# Patient Record
Sex: Female | Born: 1965 | Race: Black or African American | Hispanic: No | Smoking: Never smoker
Health system: Southern US, Community
[De-identification: ages and names within clinical notes are randomized; demographics above are authoritative.]

## PROBLEM LIST (undated history)

## (undated) DIAGNOSIS — R42 Dizziness and giddiness: Secondary | ICD-10-CM

## (undated) DIAGNOSIS — F411 Generalized anxiety disorder: Secondary | ICD-10-CM

## (undated) DIAGNOSIS — Z8041 Family history of malignant neoplasm of ovary: Secondary | ICD-10-CM

## (undated) DIAGNOSIS — N390 Urinary tract infection, site not specified: Secondary | ICD-10-CM

## (undated) DIAGNOSIS — L509 Urticaria, unspecified: Secondary | ICD-10-CM

## (undated) DIAGNOSIS — J45909 Unspecified asthma, uncomplicated: Secondary | ICD-10-CM

## (undated) DIAGNOSIS — K589 Irritable bowel syndrome without diarrhea: Secondary | ICD-10-CM

## (undated) DIAGNOSIS — L989 Disorder of the skin and subcutaneous tissue, unspecified: Secondary | ICD-10-CM

## (undated) DIAGNOSIS — L2089 Other atopic dermatitis: Secondary | ICD-10-CM

## (undated) DIAGNOSIS — G629 Polyneuropathy, unspecified: Secondary | ICD-10-CM

## (undated) DIAGNOSIS — N951 Menopausal and female climacteric states: Secondary | ICD-10-CM

## (undated) DIAGNOSIS — I10 Essential (primary) hypertension: Secondary | ICD-10-CM

## (undated) DIAGNOSIS — M2550 Pain in unspecified joint: Secondary | ICD-10-CM

## (undated) DIAGNOSIS — R109 Unspecified abdominal pain: Secondary | ICD-10-CM

## (undated) DIAGNOSIS — T7421XA Adult sexual abuse, confirmed, initial encounter: Secondary | ICD-10-CM

## (undated) DIAGNOSIS — K219 Gastro-esophageal reflux disease without esophagitis: Secondary | ICD-10-CM

## (undated) DIAGNOSIS — M25569 Pain in unspecified knee: Secondary | ICD-10-CM

## (undated) DIAGNOSIS — J309 Allergic rhinitis, unspecified: Secondary | ICD-10-CM

## (undated) DIAGNOSIS — E559 Vitamin D deficiency, unspecified: Secondary | ICD-10-CM

## (undated) DIAGNOSIS — F419 Anxiety disorder, unspecified: Secondary | ICD-10-CM

## (undated) DIAGNOSIS — R253 Fasciculation: Secondary | ICD-10-CM

## (undated) DIAGNOSIS — R1013 Epigastric pain: Secondary | ICD-10-CM

## (undated) DIAGNOSIS — N3946 Mixed incontinence: Secondary | ICD-10-CM

## (undated) DIAGNOSIS — K6289 Other specified diseases of anus and rectum: Secondary | ICD-10-CM

## (undated) DIAGNOSIS — I4510 Unspecified right bundle-branch block: Secondary | ICD-10-CM

## (undated) DIAGNOSIS — G8929 Other chronic pain: Secondary | ICD-10-CM

## (undated) DIAGNOSIS — J029 Acute pharyngitis, unspecified: Secondary | ICD-10-CM

## (undated) DIAGNOSIS — L219 Seborrheic dermatitis, unspecified: Secondary | ICD-10-CM

## (undated) DIAGNOSIS — K5790 Diverticulosis of intestine, part unspecified, without perforation or abscess without bleeding: Secondary | ICD-10-CM

## (undated) DIAGNOSIS — M1710 Unilateral primary osteoarthritis, unspecified knee: Secondary | ICD-10-CM

## (undated) DIAGNOSIS — N318 Other neuromuscular dysfunction of bladder: Secondary | ICD-10-CM

## (undated) HISTORY — DX: Generalized anxiety disorder: F41.1

## (undated) HISTORY — DX: Gastro-esophageal reflux disease without esophagitis: K21.9

## (undated) HISTORY — DX: Other neuromuscular dysfunction of bladder: N31.8

## (undated) HISTORY — DX: Allergic rhinitis, unspecified: J30.9

## (undated) HISTORY — DX: Polyneuropathy, unspecified: G62.9

## (undated) HISTORY — DX: Menopausal and female climacteric states: N95.1

## (undated) HISTORY — DX: Dizziness and giddiness: R42

## (undated) HISTORY — DX: Mixed incontinence: N39.46

## (undated) HISTORY — DX: Unspecified asthma, uncomplicated: J45.909

## (undated) HISTORY — PX: ABDOMINAL HYSTERECTOMY: SHX81

## (undated) HISTORY — DX: Pain in unspecified joint: M25.50

## (undated) HISTORY — DX: Adult sexual abuse, confirmed, initial encounter: T74.21XA

## (undated) HISTORY — DX: Acute pharyngitis, unspecified: J02.9

## (undated) HISTORY — DX: Epigastric pain: R10.13

## (undated) HISTORY — DX: Vitamin D deficiency, unspecified: E55.9

## (undated) HISTORY — DX: Anxiety disorder, unspecified: F41.9

## (undated) HISTORY — PX: KNEE SURGERY: SHX244

## (undated) HISTORY — DX: Pain in unspecified knee: M25.569

## (undated) HISTORY — DX: Disorder of the skin and subcutaneous tissue, unspecified: L98.9

## (undated) HISTORY — DX: Urticaria, unspecified: L50.9

## (undated) HISTORY — DX: Seborrheic dermatitis, unspecified: L21.9

## (undated) HISTORY — DX: Unspecified right bundle-branch block: I45.10

## (undated) HISTORY — DX: Other atopic dermatitis: L20.89

## (undated) HISTORY — DX: Essential (primary) hypertension: I10

## (undated) HISTORY — DX: Family history of malignant neoplasm of ovary: Z80.41

## (undated) HISTORY — DX: Unilateral primary osteoarthritis, unspecified knee: M17.10

## (undated) HISTORY — DX: Urinary tract infection, site not specified: N39.0

---
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
## 2000-09-03 ENCOUNTER — Encounter: Admission: RE | Admit: 2000-09-03 | Discharge: 2000-09-03 | Payer: Self-pay | Admitting: Sports Medicine

## 2000-12-06 ENCOUNTER — Encounter: Admission: RE | Admit: 2000-12-06 | Discharge: 2000-12-06 | Payer: Self-pay | Admitting: Family Medicine

## 2001-07-08 ENCOUNTER — Encounter: Admission: RE | Admit: 2001-07-08 | Discharge: 2001-07-08 | Payer: Self-pay | Admitting: Sports Medicine

## 2001-07-28 ENCOUNTER — Encounter: Admission: RE | Admit: 2001-07-28 | Discharge: 2001-07-28 | Payer: Self-pay | Admitting: Family Medicine

## 2001-08-08 ENCOUNTER — Encounter: Admission: RE | Admit: 2001-08-08 | Discharge: 2001-08-08 | Payer: Self-pay | Admitting: Family Medicine

## 2001-09-02 ENCOUNTER — Encounter: Payer: Self-pay | Admitting: *Deleted

## 2001-09-02 ENCOUNTER — Encounter: Admission: RE | Admit: 2001-09-02 | Discharge: 2001-09-02 | Payer: Self-pay | Admitting: Family Medicine

## 2001-09-02 ENCOUNTER — Encounter: Admission: RE | Admit: 2001-09-02 | Discharge: 2001-09-02 | Payer: Self-pay | Admitting: *Deleted

## 2002-03-20 ENCOUNTER — Encounter: Admission: RE | Admit: 2002-03-20 | Discharge: 2002-03-20 | Payer: Self-pay | Admitting: Family Medicine

## 2003-12-22 ENCOUNTER — Encounter: Admission: RE | Admit: 2003-12-22 | Discharge: 2003-12-22 | Payer: Self-pay | Admitting: Emergency Medicine

## 2004-10-06 ENCOUNTER — Emergency Department (HOSPITAL_COMMUNITY): Admission: EM | Admit: 2004-10-06 | Discharge: 2004-10-07 | Payer: Self-pay | Admitting: Emergency Medicine

## 2012-11-18 ENCOUNTER — Telehealth: Payer: Self-pay | Admitting: Gastroenterology

## 2012-12-22 ENCOUNTER — Telehealth (INDEPENDENT_AMBULATORY_CARE_PROVIDER_SITE_OTHER): Payer: Self-pay

## 2012-12-22 NOTE — Telephone Encounter (Signed)
Pt called [phone connection poor]. Pt called stating she was directed by Dr Norval Gable office to call our office to be seen due to increased abd pain. Pt states she was on her way to ER. I advised pt since we have not seen her and do not have her records she should go to ER if her pain is 8 or above. Pt states she will continue to ER.

## 2012-12-30 ENCOUNTER — Ambulatory Visit (INDEPENDENT_AMBULATORY_CARE_PROVIDER_SITE_OTHER): Payer: Self-pay | Admitting: Surgery

## 2013-01-01 ENCOUNTER — Encounter (INDEPENDENT_AMBULATORY_CARE_PROVIDER_SITE_OTHER): Payer: Self-pay

## 2013-01-02 ENCOUNTER — Ambulatory Visit (INDEPENDENT_AMBULATORY_CARE_PROVIDER_SITE_OTHER): Payer: Non-veteran care | Admitting: General Surgery

## 2013-01-02 ENCOUNTER — Encounter (INDEPENDENT_AMBULATORY_CARE_PROVIDER_SITE_OTHER): Payer: Self-pay | Admitting: General Surgery

## 2013-01-02 VITALS — BP 120/86 | HR 95 | Temp 98.6°F | Resp 16 | Ht 60.0 in | Wt 146.8 lb

## 2013-01-02 DIAGNOSIS — K5900 Constipation, unspecified: Secondary | ICD-10-CM | POA: Insufficient documentation
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
## 2013-01-02 NOTE — Patient Instructions (Signed)
GETTING TO GOOD BOWEL HEALTH. Irregular bowel habits such as constipation can lead to many problems over time.  Having one soft bowel movement a day is the most important way to prevent further problems.  The anorectal canal is designed to handle stretching and feces to safely manage our ability to get rid of solid waste (feces, poop, stool) out of our body.  BUT, hard constipated stools can act like ripping concrete bricks causing inflamed hemorrhoids, anal fissures, abdominal pain and bloating.     The goal: ONE SOFT BOWEL MOVEMENT A DAY!  To have soft, regular bowel movements:    Drink at least 8 tall glasses of water a day.     Take plenty of fiber.  Fiber is the undigested part of plant food that passes into the colon, acting s "natures broom" to encourage bowel motility and movement.  Fiber can absorb and hold large amounts of water. This results in a larger, bulkier stool, which is soft and easier to pass. Work gradually over several weeks up to 6 servings a day of fiber (25g a day even more if needed) in the form of: o Vegetables -- Root (potatoes, carrots, turnips), leafy green (lettuce, salad greens, celery, spinach), or cooked high residue (cabbage, broccoli, etc) o Fruit -- Fresh (unpeeled skin & pulp), Dried (prunes, apricots, cherries, etc ),  or stewed ( applesauce)  o Whole grain breads, pasta, etc (whole wheat)  o Bran cereals    Bulking Agents -- This type of water-retaining fiber generally is easily obtained each day by one of the following:  o Psyllium bran -- The psyllium plant is remarkable because its ground seeds can retain so much water. This product is available as Metamucil, Konsyl, Effersyllium, Per Diem Fiber, or the less expensive generic preparation in drug and health food stores. Although labeled a laxative, it really is not a laxative.  o Methylcellulose -- This is another fiber derived from wood which also retains water. It is available as Citrucel. o Polyethylene Glycol  - and "artificial" fiber commonly called Miralax or Glycolax.  It is helpful for people with gassy or bloated feelings with regular fiber o Flax Seed - a less gassy fiber than psyllium   No reading or other relaxing activity while on the toilet. If bowel movements take longer than 5 minutes, you are too constipated.   AVOID CONSTIPATION.  High fiber and water intake usually takes care of this.  Sometimes a laxative is needed to stimulate more frequent bowel movements, but    Laxatives are not a good long-term solution as it can wear the colon out. o Osmotics (Milk of Magnesia, Fleets phosphosoda, Magnesium citrate, MiraLax, GoLytely) are safer than  o Stimulants (Senokot, Castor Oil, Dulcolax, Ex Lax)    o Do not take laxatives for more than 7days in a row.    IF SEVERELY CONSTIPATED, try a Bowel Retraining Program: o Do not use laxatives.  o Eat a diet high in roughage, such as bran cereals and leafy vegetables.  o Drink six (6) ounces of prune or apricot juice each morning.  o Eat two (2) large servings of stewed fruit each day.  o Take one (1) heaping tablespoon of a psyllium-based bulking agent twice a day. Use sugar-free sweetener when possible to avoid excessive calories.  o Eat a normal breakfast.  o Set aside 15 minutes after breakfast to sit on the toilet, but do not strain to have a bowel movement.  o If you do   not have a bowel movement by the third day, use an enema and repeat the above steps.   Fiber Chart  You should 25-30g of fiber per day and drinking 8 glasses of water to help your bowels move regularly.  In the chart below you can look up how much fiber you are getting in an average day.  If you are not getting enough fiber, you should add a fiber supplement to your diet.  Examples of this include Metamucil, FiberCon and Citrucel.  These can be purchased at your local grocery store or pharmacy.       http://www.[employer].edu/offices/health/nutritioncoach/Ato[employer]/handouts/Fiber.pdf  

## 2013-01-26 NOTE — Telephone Encounter (Signed)
Was declined by Dr. Jarold Motto and Dr. Christella Hartigan.  Seen @ [facility]

## 2013-03-08 ENCOUNTER — Emergency Department (HOSPITAL_COMMUNITY)
Admission: EM | Admit: 2013-03-08 | Discharge: 2013-03-08 | Disposition: A | Discharge status: Home / Self Care | Payer: BC Managed Care – PPO | Attending: Emergency Medicine | Admitting: Emergency Medicine

## 2013-03-08 ENCOUNTER — Encounter (HOSPITAL_COMMUNITY): Payer: Self-pay | Admitting: Emergency Medicine

## 2013-03-08 DIAGNOSIS — K219 Gastro-esophageal reflux disease without esophagitis: Secondary | ICD-10-CM | POA: Insufficient documentation

## 2013-03-08 DIAGNOSIS — Z3202 Encounter for pregnancy test, result negative: Secondary | ICD-10-CM | POA: Insufficient documentation

## 2013-03-08 DIAGNOSIS — L2089 Other atopic dermatitis: Secondary | ICD-10-CM | POA: Insufficient documentation

## 2013-03-08 DIAGNOSIS — Z8041 Family history of malignant neoplasm of ovary: Secondary | ICD-10-CM | POA: Insufficient documentation

## 2013-03-08 DIAGNOSIS — M1710 Unilateral primary osteoarthritis, unspecified knee: Secondary | ICD-10-CM | POA: Insufficient documentation

## 2013-03-08 DIAGNOSIS — Z79899 Other long term (current) drug therapy: Secondary | ICD-10-CM | POA: Insufficient documentation

## 2013-03-08 DIAGNOSIS — K5900 Constipation, unspecified: Secondary | ICD-10-CM | POA: Insufficient documentation

## 2013-03-08 DIAGNOSIS — I4510 Unspecified right bundle-branch block: Secondary | ICD-10-CM | POA: Insufficient documentation

## 2013-03-08 DIAGNOSIS — J45909 Unspecified asthma, uncomplicated: Secondary | ICD-10-CM | POA: Insufficient documentation

## 2013-03-08 DIAGNOSIS — Z8742 Personal history of other diseases of the female genital tract: Secondary | ICD-10-CM | POA: Insufficient documentation

## 2013-03-08 DIAGNOSIS — Z87440 Personal history of urinary (tract) infections: Secondary | ICD-10-CM | POA: Insufficient documentation

## 2013-03-08 DIAGNOSIS — Z881 Allergy status to other antibiotic agents status: Secondary | ICD-10-CM | POA: Insufficient documentation

## 2013-03-08 DIAGNOSIS — I10 Essential (primary) hypertension: Secondary | ICD-10-CM | POA: Insufficient documentation

## 2013-03-08 DIAGNOSIS — J309 Allergic rhinitis, unspecified: Secondary | ICD-10-CM | POA: Insufficient documentation

## 2013-03-08 DIAGNOSIS — F411 Generalized anxiety disorder: Secondary | ICD-10-CM | POA: Insufficient documentation

## 2013-03-08 DIAGNOSIS — G589 Mononeuropathy, unspecified: Secondary | ICD-10-CM | POA: Insufficient documentation

## 2013-03-08 DIAGNOSIS — H539 Unspecified visual disturbance: Secondary | ICD-10-CM | POA: Insufficient documentation

## 2013-03-08 DIAGNOSIS — Z87448 Personal history of other diseases of urinary system: Secondary | ICD-10-CM | POA: Insufficient documentation

## 2013-03-08 DIAGNOSIS — Z8719 Personal history of other diseases of the digestive system: Secondary | ICD-10-CM | POA: Insufficient documentation

## 2013-03-08 DIAGNOSIS — R110 Nausea: Secondary | ICD-10-CM | POA: Insufficient documentation

## 2013-03-08 DIAGNOSIS — R109 Unspecified abdominal pain: Secondary | ICD-10-CM

## 2013-03-08 LAB — COMPREHENSIVE METABOLIC PANEL
ALT: 16 U/L (ref 0–35)
AST: 19 U/L (ref 0–37)
Albumin: 3.9 g/dL (ref 3.5–5.2)
BUN: 15 mg/dL (ref 6–23)
CO2: 23 mEq/L (ref 19–32)
Calcium: 9.8 mg/dL (ref 8.4–10.5)
Creatinine, Ser: 0.7 mg/dL (ref 0.50–1.10)
Sodium: 136 mEq/L (ref 135–145)
Total Protein: 7.8 g/dL (ref 6.0–8.3)

## 2013-03-08 LAB — CBC WITH DIFFERENTIAL/PLATELET
Basophils Absolute: 0 10*3/uL (ref 0.0–0.1)
Basophils Relative: 0 % (ref 0–1)
Eosinophils Absolute: 0.2 10*3/uL (ref 0.0–0.7)
Eosinophils Relative: 2 % (ref 0–5)
Lymphocytes Relative: 46 % (ref 12–46)
MCHC: 34.5 g/dL (ref 30.0–36.0)
MCV: 83.2 fL (ref 78.0–100.0)
Monocytes Absolute: 0.6 10*3/uL (ref 0.1–1.0)
Platelets: 198 10*3/uL (ref 150–400)
RBC: 4.46 MIL/uL (ref 3.87–5.11)
RDW: 13.8 % (ref 11.5–15.5)
WBC: 7.1 10*3/uL (ref 4.0–10.5)

## 2013-03-08 LAB — URINALYSIS, ROUTINE W REFLEX MICROSCOPIC
Glucose, UA: NEGATIVE mg/dL
Hgb urine dipstick: NEGATIVE
Leukocytes, UA: NEGATIVE
Specific Gravity, Urine: 1.019 (ref 1.005–1.030)

## 2013-03-08 LAB — POCT PREGNANCY, URINE: Preg Test, Ur: NEGATIVE

## 2013-03-08 MED ORDER — SODIUM CHLORIDE 0.9 % IV BOLUS (SEPSIS)
1000.0000 mL | Freq: Once | INTRAVENOUS | Status: AC
  Administered 2013-03-08: 1000 mL via INTRAVENOUS

## 2013-03-08 MED ORDER — MORPHINE SULFATE 4 MG/ML IJ SOLN
4.0000 mg | Freq: Once | INTRAMUSCULAR | Status: AC
  Administered 2013-03-08: 4 mg via INTRAVENOUS
  Filled 2013-03-08: qty 1
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
## 2013-03-08 NOTE — ED Notes (Signed)
MD at bedside. 

## 2013-03-08 NOTE — ED Notes (Signed)
Pt states understanding of discharge instructions 

## 2013-03-08 NOTE — ED Notes (Signed)
Pt reports hx of diverticulitis and recently took antibiotics for it, started having left side abd pain again last week and having mucus in stools.

## 2013-03-10 ENCOUNTER — Encounter (INDEPENDENT_AMBULATORY_CARE_PROVIDER_SITE_OTHER): Payer: Self-pay

## 2013-03-10 ENCOUNTER — Telehealth (INDEPENDENT_AMBULATORY_CARE_PROVIDER_SITE_OTHER): Payer: Self-pay

## 2013-03-10 NOTE — Telephone Encounter (Signed)
Pt called stating she is considering colon surgery and would like Dr Maisie Fus to review her CT images from [place]. Pt advised to get the images sent to Dr Maisie Fus attention [place] so MD can view images. Pt also wants to have another appt to discuss surgery since she continues to have LLQ abd pain attacks. I advised pt this request will be sent to Dr Maisie Fus and her nurse for review. Pt can be reached at [phone].

## 2013-03-10 NOTE — Telephone Encounter (Signed)
I called the pt back.  I told her we already have the CT disc.  We don't have the reports though.  The pt is driving to [place] as we speak.  She states she has been treated 3 other times for diverticular flare ups.  She has had several ct scans.  She has had colonoscopies.  All of this was done at the [place].  I asked her to get all of those reports for [place] while she is there.  We scheduled her an appointment for her to come in next week on a day that worked for her.  I can't locate her previous records in Epic that we had at her last visit in August.  I have asked Medical Records to check.

## 2013-03-13 NOTE — Telephone Encounter (Signed)
CT reports reviewed.  Would not recommend surgery at this time, based on the info that I have received.  Recommend high fiber diet, plenty of liquids and regular exercise.

## 2013-03-16 ENCOUNTER — Encounter (INDEPENDENT_AMBULATORY_CARE_PROVIDER_SITE_OTHER): Payer: Self-pay

## 2013-03-18 ENCOUNTER — Ambulatory Visit (INDEPENDENT_AMBULATORY_CARE_PROVIDER_SITE_OTHER): Payer: Federal, State, Local not specified - PPO | Admitting: General Surgery

## 2013-03-18 ENCOUNTER — Encounter (INDEPENDENT_AMBULATORY_CARE_PROVIDER_SITE_OTHER): Payer: Self-pay | Admitting: General Surgery

## 2013-03-18 VITALS — BP 124/78 | HR 88 | Temp 97.6°F | Resp 16 | Ht 61.0 in | Wt 150.4 lb

## 2013-03-18 DIAGNOSIS — R1032 Left lower quadrant pain: Secondary | ICD-10-CM

## 2013-03-18 NOTE — Progress Notes (Signed)
Maureen Smith is a 47 y.o. female who is here for a follow up visit regarding her LLQ pain.  She reports "episodes" every other month that respond to antibiotics.  She reports only pain, no fevers or elevated WBC.  She has very vague findings on CT that may represent diverticulitis.  This did not correlate with an elevated wbc.  Objective: Filed Vitals:   03/18/13 1637  BP: 124/78  Pulse: 88  Temp: 97.6 F (36.4 C)  Resp: 16    General appearance: alert and cooperative GI: normal findings: soft, non-tender   Assessment and Plan: Given the evidence that has presented to me, I do not see any evidence that removing her sigmoid would definitely help her pain.  I gave her a 50% chance that a colectomy would help her.  That is not enough for me to do a colectomy.  I recommended continuing to work up her pain with her GI specialist at the [place].      Marland KitchenVanita Panda, MD [facility], [place] [phone]

## 2013-03-26 ENCOUNTER — Other Ambulatory Visit: Payer: Self-pay

## 2013-08-14 ENCOUNTER — Ambulatory Visit (INDEPENDENT_AMBULATORY_CARE_PROVIDER_SITE_OTHER): Payer: BC Managed Care – PPO | Admitting: Internal Medicine

## 2013-08-14 ENCOUNTER — Encounter: Payer: Self-pay | Admitting: Internal Medicine

## 2013-08-14 VITALS — BP 133/86 | HR 89 | Ht 61.0 in | Wt 149.0 lb

## 2013-08-14 DIAGNOSIS — R0602 Shortness of breath: Secondary | ICD-10-CM

## 2013-08-14 DIAGNOSIS — Z0000 Encounter for general adult medical examination without abnormal findings: Secondary | ICD-10-CM

## 2013-08-14 NOTE — Patient Instructions (Signed)
Your physician recommends that you schedule a follow-up appointment in: [place] physician has requested that you have an echocardiogram. Echocardiography is a painless test that uses sound waves to create images of your heart. It provides your doctor with information about the size and shape of your heart and how well your heart's chambers and valves are working. This procedure takes approximately one hour. There are no restrictions for this procedure.   Your physician recommends that you return for lab work in: FASTING WITH ECHO
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
## 2013-08-27 ENCOUNTER — Other Ambulatory Visit (INDEPENDENT_AMBULATORY_CARE_PROVIDER_SITE_OTHER): Payer: Federal, State, Local not specified - PPO

## 2013-08-27 ENCOUNTER — Ambulatory Visit (HOSPITAL_COMMUNITY): Payer: Federal, State, Local not specified - PPO

## 2013-08-27 DIAGNOSIS — Z0000 Encounter for general adult medical examination without abnormal findings: Secondary | ICD-10-CM

## 2013-08-27 DIAGNOSIS — R0602 Shortness of breath: Secondary | ICD-10-CM

## 2013-08-27 LAB — LIPID PANEL
Cholesterol: 191 mg/dL (ref 0–200)
HDL: 37.1 mg/dL — ABNORMAL LOW (ref >39.00)
LDL Cholesterol: 119 mg/dL — ABNORMAL HIGH (ref 0–99)
Total CHOL/HDL Ratio: 5
Triglycerides: 174 mg/dL — ABNORMAL HIGH (ref 0.0–149.0)
VLDL: 34.8 mg/dL (ref 0.0–40.0)

## 2013-09-17 ENCOUNTER — Other Ambulatory Visit (HOSPITAL_COMMUNITY): Payer: BC Managed Care – PPO

## 2013-09-17 ENCOUNTER — Other Ambulatory Visit: Payer: BC Managed Care – PPO

## 2013-09-17 ENCOUNTER — Ambulatory Visit: Payer: BC Managed Care – PPO | Admitting: Cardiology

## 2013-09-18 ENCOUNTER — Telehealth: Payer: Self-pay | Admitting: Internal Medicine

## 2013-09-18 NOTE — Telephone Encounter (Signed)
Forward to Debra M. RN 

## 2013-09-18 NOTE — Telephone Encounter (Signed)
New message    Patient calling stating she will be dropping off echo results she had done at [place] .

## 2013-09-21 NOTE — Telephone Encounter (Signed)
I have not seen echo from [place]

## 2013-09-23 ENCOUNTER — Telehealth: Payer: Self-pay | Admitting: Internal Medicine

## 2013-09-23 NOTE — Telephone Encounter (Signed)
Walk In pt Form " Echo Report" Dropped Off Maureen Smith Back Thursday 5.7

## 2013-09-30 ENCOUNTER — Encounter: Payer: Self-pay | Admitting: *Deleted

## 2013-09-30 NOTE — Telephone Encounter (Signed)
Left message for pt to call  Dr Harrington Challenger has reviewed echo from the [place] She would like the pt to have a dobutamine echo with diastolic prep at peck infusion along with wall motion.

## 2013-10-14 ENCOUNTER — Encounter: Payer: Self-pay | Admitting: *Deleted

## 2013-10-15 ENCOUNTER — Telehealth: Payer: Self-pay | Admitting: *Deleted

## 2013-10-15 ENCOUNTER — Ambulatory Visit: Payer: Federal, State, Local not specified - PPO | Admitting: Physician Assistant

## 2013-10-15 DIAGNOSIS — R0600 Dyspnea, unspecified: Secondary | ICD-10-CM

## 2013-10-15 NOTE — Telephone Encounter (Signed)
pt notified about needing the Dobutamine Echo per Dr. Harrington Challenger, pt is now scheduled for 6/22 for echo and f/u w/PA 11/16/13. Pt verbalized Plan of Care.

## 2013-10-15 NOTE — Telephone Encounter (Signed)
Follow up         Pt is stating she had an echocardiogram done at the [facility] office and she brought in the results / Is this the same as a Dobutamine Echo?

## 2013-10-15 NOTE — Telephone Encounter (Signed)
lmptcb per Dr. Harrington Challenger pt needs to be scheduled for Dobutamine Echo before her f/u 6/29 w/Scott W. PAC.  dobutamine echo with diastolic prep at peck infusion along with wall motion, per Dr. Harrington Challenger

## 2013-10-15 NOTE — Telephone Encounter (Signed)
pt asked for echo instructions to be mailed to her. I lmom of her echo instructions as well as mailing them today to pt. i lmom if any questions please call [phone].

## 2013-11-09 ENCOUNTER — Ambulatory Visit (HOSPITAL_COMMUNITY): Payer: Federal, State, Local not specified - PPO | Attending: Internal Medicine

## 2013-11-09 ENCOUNTER — Other Ambulatory Visit (HOSPITAL_COMMUNITY): Payer: Federal, State, Local not specified - PPO | Admitting: *Deleted

## 2013-11-09 DIAGNOSIS — R0600 Dyspnea, unspecified: Secondary | ICD-10-CM

## 2013-11-09 DIAGNOSIS — R0609 Other forms of dyspnea: Secondary | ICD-10-CM | POA: Insufficient documentation

## 2013-11-09 DIAGNOSIS — R079 Chest pain, unspecified: Secondary | ICD-10-CM

## 2013-11-09 DIAGNOSIS — R0602 Shortness of breath: Secondary | ICD-10-CM

## 2013-11-09 DIAGNOSIS — R0989 Other specified symptoms and signs involving the circulatory and respiratory systems: Secondary | ICD-10-CM | POA: Insufficient documentation

## 2013-11-09 MED ORDER — SODIUM CHLORIDE 0.9 % IV SOLN
40.0000 ug/kg | Freq: Once | INTRAVENOUS | Status: AC
  Administered 2013-11-09: 40 ug/kg/min via INTRAVENOUS

## 2013-11-09 NOTE — Progress Notes (Signed)
Dobutamine stress echo performed.

## 2013-11-12 ENCOUNTER — Telehealth: Payer: Self-pay | Admitting: Cardiology

## 2013-11-12 NOTE — Telephone Encounter (Signed)
Pt calling to inform Dr Harrington Challenger, Richardson Dopp PA, and CMA Julaine Hua that instead of having to come in for a office visit to review echo results, could someone just call her with the Dobutamine Echo results and follow-up as needed.  Pt states its very hard to come in for OV because of working a strict schedule with the [place].  Pt has a appt with Scott on 7/21, and states to keep that if its absolutely necessary for her to be seen.  Pt states the appt with Scott on 7/21 can be cancelled, if the echo report reveals normal.  Informed pt that all parties are out of the office today, but I will further advise by sending them this message.  Pt verbalized understanding and agrees with this plan.

## 2013-11-12 NOTE — Telephone Encounter (Signed)
New Message  Instead of coming in for the appt. Pt requests a call back to discuss ECHO results// Please call

## 2013-11-13 NOTE — Telephone Encounter (Signed)
This is up to Dr. Harrington Challenger. I have not seen this patient yet and do not know the reason for the Dobutamine stress test. Richardson Dopp, PA-C   11/13/2013 2:08 PM

## 2013-11-16 ENCOUNTER — Ambulatory Visit: Payer: Federal, State, Local not specified - PPO | Admitting: Physician Assistant

## 2013-11-17 ENCOUNTER — Telehealth: Payer: Self-pay | Admitting: *Deleted

## 2013-11-17 DIAGNOSIS — R0602 Shortness of breath: Secondary | ICD-10-CM

## 2013-11-17 NOTE — Telephone Encounter (Signed)
Per Dr. Harrington Challenger, she left patient a message. "Stress test is normal, SOB does not appear to be due to heart changes." Per Dr. Harrington Challenger can refer for PFTs. Appointment 7/21 cancelled.

## 2013-12-08 ENCOUNTER — Ambulatory Visit: Payer: Federal, State, Local not specified - PPO | Admitting: Physician Assistant

## 2014-09-27 ENCOUNTER — Emergency Department (HOSPITAL_COMMUNITY): Payer: Federal, State, Local not specified - PPO

## 2014-09-27 ENCOUNTER — Encounter (HOSPITAL_COMMUNITY): Payer: Self-pay

## 2014-09-27 ENCOUNTER — Emergency Department (HOSPITAL_COMMUNITY)
Admission: EM | Admit: 2014-09-27 | Discharge: 2014-09-27 | Disposition: A | Discharge status: Home / Self Care | Payer: Federal, State, Local not specified - PPO | Attending: Emergency Medicine | Admitting: Emergency Medicine

## 2014-09-27 DIAGNOSIS — I10 Essential (primary) hypertension: Secondary | ICD-10-CM | POA: Diagnosis not present

## 2014-09-27 DIAGNOSIS — Z79899 Other long term (current) drug therapy: Secondary | ICD-10-CM | POA: Insufficient documentation

## 2014-09-27 DIAGNOSIS — E559 Vitamin D deficiency, unspecified: Secondary | ICD-10-CM | POA: Insufficient documentation

## 2014-09-27 DIAGNOSIS — R109 Unspecified abdominal pain: Secondary | ICD-10-CM

## 2014-09-27 DIAGNOSIS — K602 Anal fissure, unspecified: Secondary | ICD-10-CM | POA: Diagnosis not present

## 2014-09-27 DIAGNOSIS — Z872 Personal history of diseases of the skin and subcutaneous tissue: Secondary | ICD-10-CM | POA: Insufficient documentation

## 2014-09-27 DIAGNOSIS — G8929 Other chronic pain: Secondary | ICD-10-CM | POA: Insufficient documentation

## 2014-09-27 DIAGNOSIS — Z87440 Personal history of urinary (tract) infections: Secondary | ICD-10-CM | POA: Diagnosis not present

## 2014-09-27 DIAGNOSIS — Z8659 Personal history of other mental and behavioral disorders: Secondary | ICD-10-CM | POA: Diagnosis not present

## 2014-09-27 DIAGNOSIS — Z8739 Personal history of other diseases of the musculoskeletal system and connective tissue: Secondary | ICD-10-CM | POA: Diagnosis not present

## 2014-09-27 DIAGNOSIS — Z87448 Personal history of other diseases of urinary system: Secondary | ICD-10-CM | POA: Insufficient documentation

## 2014-09-27 DIAGNOSIS — R1084 Generalized abdominal pain: Secondary | ICD-10-CM | POA: Diagnosis present

## 2014-09-27 DIAGNOSIS — J45909 Unspecified asthma, uncomplicated: Secondary | ICD-10-CM | POA: Diagnosis not present

## 2014-09-27 DIAGNOSIS — Z90710 Acquired absence of both cervix and uterus: Secondary | ICD-10-CM | POA: Insufficient documentation

## 2014-09-27 DIAGNOSIS — K529 Noninfective gastroenteritis and colitis, unspecified: Secondary | ICD-10-CM

## 2014-09-27 HISTORY — DX: Irritable bowel syndrome, unspecified: K58.9

## 2014-09-27 HISTORY — DX: Other chronic pain: G89.29

## 2014-09-27 HISTORY — DX: Unspecified abdominal pain: R10.9

## 2014-09-27 HISTORY — DX: Other specified diseases of anus and rectum: K62.89

## 2014-09-27 HISTORY — DX: Diverticulosis of intestine, part unspecified, without perforation or abscess without bleeding: K57.90

## 2014-09-27 LAB — URINALYSIS, ROUTINE W REFLEX MICROSCOPIC
BILIRUBIN URINE: NEGATIVE
Glucose, UA: NEGATIVE mg/dL
HGB URINE DIPSTICK: NEGATIVE
Ketones, ur: NEGATIVE mg/dL
Leukocytes, UA: NEGATIVE
NITRITE: NEGATIVE
Protein, ur: NEGATIVE mg/dL
Specific Gravity, Urine: 1.006 (ref 1.005–1.030)
UROBILINOGEN UA: 0.2 mg/dL (ref 0.0–1.0)
pH: 7.5 (ref 5.0–8.0)

## 2014-09-27 LAB — COMPREHENSIVE METABOLIC PANEL WITH GFR
ALT: 14 U/L (ref 14–54)
AST: 24 U/L (ref 15–41)
Albumin: 4 g/dL (ref 3.5–5.0)
Alkaline Phosphatase: 71 U/L (ref 38–126)
Anion gap: 12 (ref 5–15)
BUN: 15 mg/dL (ref 6–20)
CO2: 23 mmol/L (ref 22–32)
Calcium: 9.4 mg/dL (ref 8.9–10.3)
Chloride: 102 mmol/L (ref 101–111)
Creatinine, Ser: 0.82 mg/dL (ref 0.44–1.00)
GFR calc Af Amer: >60 mL/min
GFR calc non Af Amer: >60 mL/min
Glucose, Bld: 115 mg/dL — ABNORMAL HIGH (ref 70–99)
Potassium: 3.8 mmol/L (ref 3.5–5.1)
Sodium: 137 mmol/L (ref 135–145)
Total Bilirubin: 0.9 mg/dL (ref 0.3–1.2)
Total Protein: 7.5 g/dL (ref 6.5–8.1)

## 2014-09-27 LAB — CBC WITH DIFFERENTIAL/PLATELET
Basophils Absolute: 0 K/uL (ref 0.0–0.1)
Basophils Relative: 0 % (ref 0–1)
Eosinophils Absolute: 0.1 K/uL (ref 0.0–0.7)
Eosinophils Relative: 1 % (ref 0–5)
HCT: 38.1 % (ref 36.0–46.0)
Hemoglobin: 12.6 g/dL (ref 12.0–15.0)
Lymphocytes Relative: 26 % (ref 12–46)
Lymphs Abs: 2.1 K/uL (ref 0.7–4.0)
MCH: 27.8 pg (ref 26.0–34.0)
MCHC: 33.1 g/dL (ref 30.0–36.0)
MCV: 83.9 fL (ref 78.0–100.0)
Monocytes Absolute: 0.5 K/uL (ref 0.1–1.0)
Monocytes Relative: 6 % (ref 3–12)
Neutro Abs: 5.4 K/uL (ref 1.7–7.7)
Neutrophils Relative %: 67 % (ref 43–77)
Platelets: 231 K/uL (ref 150–400)
RBC: 4.54 MIL/uL (ref 3.87–5.11)
RDW: 13.9 % (ref 11.5–15.5)
WBC: 8 K/uL (ref 4.0–10.5)

## 2014-09-27 LAB — POC OCCULT BLOOD, ED: Fecal Occult Bld: POSITIVE — AB

## 2014-09-27 LAB — LIPASE, BLOOD: Lipase: 15 U/L — ABNORMAL LOW (ref 22–51)

## 2014-09-27 MED ORDER — METRONIDAZOLE 500 MG PO TABS: 500.0000 mg | ORAL_TABLET | Freq: Three times a day (TID) | ORAL | Status: DC

## 2014-09-27 MED ORDER — CIPROFLOXACIN HCL 500 MG PO TABS
500.0000 mg | ORAL_TABLET | Freq: Once | ORAL | Status: AC
  Administered 2014-09-27: 500 mg via ORAL
  Filled 2014-09-27: qty 1

## 2014-09-27 MED ORDER — METRONIDAZOLE 500 MG PO TABS
500.0000 mg | ORAL_TABLET | Freq: Once | ORAL | Status: AC
  Administered 2014-09-27: 500 mg via ORAL
  Filled 2014-09-27: qty 1

## 2014-09-27 MED ORDER — DICYCLOMINE HCL 10 MG/ML IM SOLN
20.0000 mg | Freq: Once | INTRAMUSCULAR | Status: AC
  Administered 2014-09-27: 20 mg via INTRAMUSCULAR
  Filled 2014-09-27: qty 2

## 2014-09-27 MED ORDER — ONDANSETRON HCL 4 MG/2ML IJ SOLN
4.0000 mg | INTRAMUSCULAR | Status: DC | PRN
  Administered 2014-09-27: 4 mg via INTRAVENOUS
  Filled 2014-09-27: qty 2

## 2014-09-27 MED ORDER — HYDROCODONE-ACETAMINOPHEN 5-325 MG PO TABS: ORAL_TABLET | ORAL | Status: DC

## 2014-09-27 MED ORDER — MORPHINE SULFATE 4 MG/ML IJ SOLN
4.0000 mg | INTRAMUSCULAR | Status: DC | PRN
  Administered 2014-09-27: 4 mg via INTRAVENOUS
  Filled 2014-09-27: qty 1

## 2014-09-27 MED ORDER — IOHEXOL 240 MG/ML SOLN
50.0000 mL | Freq: Once | INTRAMUSCULAR | Status: AC | PRN
  Administered 2014-09-27: 50 mL via ORAL

## 2014-09-27 MED ORDER — SODIUM CHLORIDE 0.9 % IV SOLN
INTRAVENOUS | Status: DC
  Administered 2014-09-27: 125 mL/h via INTRAVENOUS

## 2014-09-27 MED ORDER — CIPROFLOXACIN HCL 500 MG PO TABS: 500.0000 mg | ORAL_TABLET | Freq: Two times a day (BID) | ORAL | Status: DC

## 2014-09-27 MED ORDER — PROMETHAZINE HCL 25 MG PO TABS: 25.0000 mg | ORAL_TABLET | Freq: Four times a day (QID) | ORAL | Status: DC | PRN

## 2014-09-27 NOTE — Discharge Instructions (Signed)
°Emergency Department Resource Guide °1) Find a Doctor and Pay Out of Pocket °Although you won't have to find out who is covered by your insurance plan, it is a good idea to ask around and get recommendations. You will then need to call the office and see if the doctor you have chosen will accept you as a new patient and what types of options they offer for patients who are self-pay. Some doctors offer discounts or will set up payment plans for their patients who do not have insurance, but you will need to ask so you aren't surprised when you get to your appointment. ° °2) Contact Your Local Health Department °Not all health departments have doctors that can see patients for sick visits, but many do, so it is worth a call to see if yours does. If you don't know where your local health department is, you can check in your phone book. The CDC also has a tool to help you locate your state's health department, and many state websites also have listings of all of their local health departments. ° °3) Find a Walk-in Clinic °If your illness is not likely to be very severe or complicated, you may want to try a walk in clinic. These are popping up all over the country in pharmacies, drugstores, and shopping centers. They're usually staffed by nurse practitioners or physician assistants that have been trained to treat common illnesses and complaints. They're usually fairly quick and inexpensive. However, if you have serious medical issues or chronic medical problems, these are probably not your best option. ° °No Primary Care Doctor: °- Call Health Connect at  832-8000 - they can help you locate a primary care doctor that  accepts your insurance, provides certain services, etc. °- Physician Referral Service- 1-800-533-3463 ° °Chronic Pain Problems: °Organization         Address  Phone   Notes  °[facility]  (336) 297-2271 Patients need to be referred by their primary care doctor.  ° °Medication  Assistance: °Organization         Address  Phone   Notes  °[place] [facility] [address] °[place], [place] [address] (336) 641-8030 --Must be a resident of [place] °-- Must have NO insurance coverage whatsoever (no Medicaid/ Medicare, etc.) °-- The pt. MUST have a primary care doctor that directs their care regularly and follows them in the community °  °[facility]  (866) 331-1348   °[employer]  (888) 892-1162   ° °Agencies that provide inexpensive medical care: °Organization         Address  Phone   Notes  °[facility]  (336) 832-8035   °[facility]    (336) 832-7272   °[facility] [address] °[place], [place] [address] (336) 832-4777   °[facility] [address], °[place] (336) 271-4999   °[facility]    (336) 373-0678   °[facility]    (336) 272-1050   °[facility] ° [address], [place] Phone:  (336) 832-4444, Fax:  (336) 832-4440 Hours of Operation:  9 am - 6 pm, M-F.  Also accepts Medicaid/Medicare and self-pay.  °[facility] ° [address], [place] Phone: (336) 832-3150, Fax: (336) 832-3151. Hours of Operation:  8:30 am - 5:30 pm, M-F.  Also accepts Medicaid and self-pay.  °[facility] [place] [address], [place] Phone: (336) 878-6027   °[facility] [address], [place], [place] (336)723-1848, Ext. 123 Mondays & Thursdays: 7-9 AM.  First 15 patients are seen on a first come, first serve basis. °  ° °Medicaid-accepting [place] Providers: ° °Organization         Address  Phone   Notes  °[facility] [address], [place] (336) 641-2100 Also accepts self-pay patients.  °[facility] [address], [place] ° (336) 856-9996   °[facility] [address], [place]  (336) 288-8857   °[facility] [address] [place] [address], [place] (336) 299-7000   °Veita Bland [address], [place]  ° (336) 373-1557 Only accepts [place] Access Medicaid patients after they have their name applied to their card.  ° °Self-Pay (no insurance) in [place]: ° °Organization         Address  Phone   Notes  °Sickle Cell Patients, [facility] [address], [place] (336) 832-1970   °[facility] [address], [place] (336) 832-4400   °[facility] ° [address], [place] (336) 992-4800   °[facility]/Dr. Osei-Bonsu ° [address] [place] [address], [place] or [address], [place] (336) 841-8500 Phone number for both [place] and [place] locations is the same.  °[facility] [address], [place] (336) 299-0000   °[facility] [address] [place] [address], [place] or [address] (336) 852-7530 °(336) 878-2260   °[facility] [address], [place] (336) 350-1642, phone; (336) 294-5005, fax Sees patients 1st and 3rd Saturday of every month.  Must not qualify for public or private insurance (i.e. Medicaid, Medicare, [place] Health Choice, Veterans' Benefits) • Household income should be no more than 200% of the poverty level •The clinic cannot treat you if you are pregnant or think you are pregnant • Sexually transmitted diseases are not treated at the clinic.  ° ° °Dental Care: °Organization         Address  Phone  Notes  °[place] [facility] [address], [place] (336) 641-6152 Accepts children up to age 21 who are enrolled in Medicaid or [place] Health Choice; pregnant women with a Medicaid card; and children who have applied for Medicaid or [place] Health Choice, but were declined, whose parents can pay a reduced fee at time of service.  °[place]  [facility] [place]  [address], [place] (336) 641-7733 Accepts children up to age 21 who are enrolled in Medicaid or Veteran Health Choice; pregnant women with a Medicaid card; and children who have applied for Medicaid or [place] Health Choice, but were declined, whose parents can pay a reduced fee at time of service.  °[facility] ° [address],  (336) 641-4533 Patients are seen by appointment only. Walk-ins are not accepted. [facility] will see patients 18 years of age and older. °Monday - Tuesday (8am-5pm) °Most Wednesdays (8:30-5pm) °$30 per visit, cash only  °[facility] ° [address], [place] (336) 641-4533 Patients are seen by appointment only. Walk-ins are not accepted. [facility] will see patients 18 years of age and older. °One   Wednesday Evening (Monthly: Volunteer Based).  $30 per visit, cash only  °[facility]  (919) 537-3737 for adults; Children under age 4, call Graduate Pediatric Dentistry at ([phone]) [phone]. Children aged 4-14, please call ([phone]) [phone] to request a pediatric application. ° Dental services are provided in all areas of dental care including fillings, crowns and bridges, complete and partial dentures, implants, gum treatment, root canals, and extractions. Preventive care is also provided. Treatment is provided to both adults and children. °Patients are selected via a lottery and there is often a waiting list. °  °[facility] [address], °[place] ° (336) 763-8833 [email or web address] °  °[facility] [address], [place], [place] (336)723-1848, Ext. 123 Second and Fourth Thursday of each month, opens at 6:30 AM; Clinic ends at 9 AM.  Patients are seen on a first-come first-served basis, and a limited number are seen during each clinic.  ° °[facility] ° [address], [place], [place] (336) 723-7904    Eligibility Requirements °You must have lived in [place], [place], or [place] counties for at least the last three months. °  You cannot be eligible for state or federal sponsored healthcare insurance, including Veterans Administration, Medicaid, or Medicare. °  You generally cannot be eligible for healthcare insurance through your employer.  °  How to apply: °Eligibility screenings are held every Tuesday and Wednesday afternoon from 1:00 pm until 4:00 pm. You do not need an appointment for the interview!  °[facility] [address], [place], [place] 336-631-2330   °[facility]  336-342-8273   °[place]  336-703-3100   ° [facility]  336-570-6415   ° °Behavioral Health Resources in the Community: °Intensive Outpatient Programs °Organization         Address  Phone  Notes  °[facility] [address], [place], [place] 336-878-6098   °[facility] [address], [place], [place] 336-832-9800   °[facility] [address], [place], [place] ° 336-882-2125   °[facility] [address],  °[place], [place] 1-800-853-5163 or 336-641-4981   °Substance Abuse Resources °Organization         Address  Phone  Notes  °[facility]  336-882-2125   °[facility]  336-784-9470   °[facility]  336-285-9073   °[facility]  336-845-3988   °Residential & Outpatient Substance Abuse Program  1-800-659-3381   °Psychological Services °Organization         Address  Phone  Notes  °[facility]  336- 832-9600   °[facility]  336- 378-7881   °[facility] [address], [place] 1-800-853-5163 or 336-641-4981   ° °Mobile Crisis Teams °Organization         Address  Phone  Notes  °[facility], Mobile Crisis Care Unit  1-877-626-1772   °Assertive °[facility] ° [address]  [place], [place] 336-834-9664   °Sharon DeEsch [address] °[place]  336-554-5454   ° °Self-Help/Support Groups °Organization         Address  Phone             Notes  °[facility] - variety of support groups  336- 373-1402 Call for more information  °Narcotics Anonymous (NA), [facility] [address], °[place]   2 meetings at this location  ° °  Residential Treatment Programs Organization         Address  Phone  Notes  [facility] [address],    [place]  1-878-570-7328   [facility]  [address], [place] [address], [place], [place]   [place], [place] (430) 871-2344 Admissions: 8am-3pm M-F  [facility] [address]    [place], [place] 045-409-8119   [facility] [address] [place], [place], [place]   [facility] [address],  [place], [place]   [facility] Intensive Outpatient [address], [place], [place]   [facility] ([place]  [place], [place] 1-260-746-3706 or (619)667-3297   [facility] [address] [place], [place] Accepts Medicaid  [facility] [place] [address]  [place] 1-267-135-2838 Substance Abuse/Addiction Treatment   [facility]         Address  Phone  Notes  [facility]  (754)841-8548   Domenic Schwab, PhD [address] [place], [place]   (602)681-8843 or (213) 784-4462   [place], [place] (639) 719-7994   [facility] [address] [place], [place] 301-304-6128 Insurance/Medicaid/sponsorship through [facility] [address]                                    [place], [place] [phone] [place] [address], [place] (859)[phone]    Dr. Adele Schilder  [phone]   [facility] Dept. 1) [address] [place] 2) [place] 3)  [address], [place] [phone]  430-579-2571   [place] (423)604-0677 or 445-101-8786 (After Hours)      Take the prescriptions as directed. Increase your fluid intake (ie:  Gatoraide) for the next few days.  Eat a bland diet and advance to your regular diet slowly as you can tolerate it.   Avoid full strength juices, as well as milk and milk products until your diarrhea has resolved.  Use over the counter hemorrhoidal relief products (ie:  preparation H, anusol), as directed on packaging, as needed for rectal symptoms. Sit in a warm water tub several times per day for the next 1 to 2 week.  Call your regular medical doctor today to schedule a follow up appointment within the next 2 to 3 days. Call your regular GI doctor today to schedule a follow up within the next week.  Return to the Emergency Department immediately if worsening.
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
## 2014-09-27 NOTE — ED Notes (Signed)
Pt is aware of urine sample 

## 2014-09-27 NOTE — ED Notes (Signed)
MD at bedside. 

## 2014-09-27 NOTE — ED Notes (Signed)
Pt presents with c/o abdominal cramping and rectal bleeding. Pt reports the bleeding started around 2 this morning but the abdominal pains have been off and on for about a week. Pt reports some nausea at this time as well.

## 2018-06-26 ENCOUNTER — Encounter: Payer: Self-pay | Admitting: Cardiology

## 2018-06-26 ENCOUNTER — Ambulatory Visit (INDEPENDENT_AMBULATORY_CARE_PROVIDER_SITE_OTHER): Payer: No Typology Code available for payment source | Admitting: Cardiology

## 2018-06-26 VITALS — BP 100/70 | HR 92 | Ht 60.0 in | Wt 159.2 lb

## 2018-06-26 DIAGNOSIS — R079 Chest pain, unspecified: Secondary | ICD-10-CM | POA: Diagnosis not present

## 2018-06-26 DIAGNOSIS — Z87898 Personal history of other specified conditions: Secondary | ICD-10-CM | POA: Insufficient documentation

## 2018-06-26 DIAGNOSIS — R9439 Abnormal result of other cardiovascular function study: Secondary | ICD-10-CM

## 2018-06-26 DIAGNOSIS — Z01812 Encounter for preprocedural laboratory examination: Secondary | ICD-10-CM | POA: Diagnosis not present

## 2018-06-26 DIAGNOSIS — I4510 Unspecified right bundle-branch block: Secondary | ICD-10-CM | POA: Diagnosis not present

## 2018-06-26 DIAGNOSIS — Z7189 Other specified counseling: Secondary | ICD-10-CM
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
## 2018-06-26 NOTE — Patient Instructions (Signed)
Medication Instructions:  Your Physician recommend you continue on your current medication as directed.    If you need a refill on your cardiac medications before your next appointment, please call your pharmacy.   Lab work: Your physician recommends that you return for lab work today (CBC, BMP)  If you have labs (blood work) drawn today and your tests are completely normal, you will receive your results only by: Marland Kitchen MyChart Message (if you have MyChart) OR . A paper copy in the mail If you have any lab test that is abnormal or we need to change your treatment, we will call you to review the results.  Testing/Procedures: Will call once records are received to schedule procedure.  Follow-Up: At [facility], you and your health needs are our priority.  As part of our continuing mission to provide you with exceptional heart care, we have created designated Provider Care Teams.  These Care Teams include your primary Cardiologist (physician) and Advanced Practice Providers (APPs -  Physician Assistants and Nurse Practitioners) who all work together to provide you with the care you need, when you need it. You will need a follow up appointment as needed.  Please call our office 2 months in advance to schedule this appointment.  You may see Dr. Harrell Gave or one of the following Advanced Practice Providers on your designated Care Team:   Rosaria Ferries, PA-C . Jory Sims, DNP, ANP

## 2018-06-27 ENCOUNTER — Telehealth: Payer: Self-pay

## 2018-06-27 DIAGNOSIS — Z01812 Encounter for preprocedural laboratory examination: Secondary | ICD-10-CM

## 2018-06-27 DIAGNOSIS — R079 Chest pain, unspecified: Secondary | ICD-10-CM

## 2018-06-27 LAB — CBC
Hematocrit: 43.7 % (ref 34.0–46.6)
Hemoglobin: 14.4 g/dL (ref 11.1–15.9)
MCH: 27.4 pg (ref 26.6–33.0)
MCHC: 33 g/dL (ref 31.5–35.7)
MCV: 83 fL (ref 79–97)
Platelets: 255 10*3/uL (ref 150–450)
RBC: 5.26 x10E6/uL (ref 3.77–5.28)
RDW: 14.6 % (ref 11.7–15.4)
WBC: 7.1 10*3/uL (ref 3.4–10.8)

## 2018-06-27 LAB — BASIC METABOLIC PANEL
BUN/Creatinine Ratio: 15 (ref 9–23)
BUN: 12 mg/dL (ref 6–24)
CO2: 21 mmol/L (ref 20–29)
CREATININE: 0.79 mg/dL (ref 0.57–1.00)
Calcium: 9.6 mg/dL (ref 8.7–10.2)
Chloride: 106 mmol/L (ref 96–106)
GFR calc Af Amer: 101 mL/min/{1.73_m2} (ref >59)
GFR calc non Af Amer: 88 mL/min/{1.73_m2} (ref >59)
Glucose: 74 mg/dL (ref 65–99)
Potassium: 4.2 mmol/L (ref 3.5–5.2)
SODIUM: 145 mmol/L — AB (ref 134–144)

## 2018-06-27 MED ORDER — METOPROLOL TARTRATE 50 MG PO TABS: ORAL_TABLET | ORAL | 0 refills | Status: DC

## 2018-06-27 NOTE — Telephone Encounter (Signed)
Spoke with pt, she states she will call to have them fax over results.

## 2018-06-27 NOTE — Telephone Encounter (Signed)
Attempted to contact [facility] to request radiology report. Unable to get anyone on the phone from medical records. Contacted pt to inform of status. Left message for her to call back.

## 2018-06-27 NOTE — Telephone Encounter (Signed)
Informed pt that Dr. Harrell Gave has reviewed the radiology report and feel she doesn't need a cath, but if she is still concerned and want a more definitive test, she would recommend a Cardiac CTA. Pt is agreeable with plan and would like to proceed. Orders placed and instructed provided to pt. Will mail instructions as well.

## 2018-06-27 NOTE — Addendum Note (Signed)
Addended by: Meryl Crutch on: 06/27/2018 01:51 PM   Modules accepted: Orders

## 2018-07-03 ENCOUNTER — Ambulatory Visit: Payer: Federal, State, Local not specified - PPO | Admitting: Internal Medicine

## 2018-07-15 ENCOUNTER — Encounter (HOSPITAL_COMMUNITY): Payer: Self-pay | Admitting: Emergency Medicine

## 2018-07-16 ENCOUNTER — Telehealth (HOSPITAL_COMMUNITY): Payer: Self-pay | Admitting: Emergency Medicine

## 2018-07-16 NOTE — Telephone Encounter (Signed)
Encounter complete. 

## 2018-07-16 NOTE — Telephone Encounter (Signed)
Pt returning phone call regarding upcoming cardiac imaging study; pt verbalizes understanding of appt date/time, parking situation and where to check in, pre-test NPO status and medications ordered, and verified current allergies; name and call back number provided for further questions should they arise Caryl Fate RN Navigator Cardiac Imaging [facility] [phone] office [phone] cell   

## 2018-07-16 NOTE — Telephone Encounter (Signed)
Left message on voicemail with name and callback number Kei Langhorst RN Navigator Cardiac Imaging [facility] [phone] Office [phone] Cell  

## 2018-07-17 ENCOUNTER — Ambulatory Visit (HOSPITAL_COMMUNITY): Payer: Non-veteran care

## 2018-07-17 ENCOUNTER — Ambulatory Visit (HOSPITAL_COMMUNITY): Admission: RE | Admit: 2018-07-17 | Payer: No Typology Code available for payment source | Source: Ambulatory Visit

## 2018-07-17 ENCOUNTER — Encounter: Payer: Non-veteran care | Admitting: *Deleted

## 2018-07-17 ENCOUNTER — Ambulatory Visit (HOSPITAL_COMMUNITY)
Admission: RE | Admit: 2018-07-17 | Discharge: 2018-07-17 | Disposition: A | Discharge status: Home / Self Care | Payer: No Typology Code available for payment source | Source: Ambulatory Visit | Attending: Cardiology | Admitting: Cardiology

## 2018-07-17 DIAGNOSIS — R079 Chest pain, unspecified: Secondary | ICD-10-CM | POA: Insufficient documentation

## 2018-07-17 DIAGNOSIS — Z006 Encounter for examination for normal comparison and control in clinical research program: Secondary | ICD-10-CM

## 2018-07-17 MED ORDER — IOPAMIDOL (ISOVUE-370) INJECTION 76%
80.0000 mL | Freq: Once | INTRAVENOUS | Status: AC | PRN
  Administered 2018-07-17: 80 mL via INTRAVENOUS

## 2018-07-17 MED ORDER — METOPROLOL TARTRATE 5 MG/5ML IV SOLN
INTRAVENOUS | Status: AC
  Filled 2018-07-17: qty 5

## 2018-07-17 MED ORDER — METOPROLOL TARTRATE 5 MG/5ML IV SOLN
5.0000 mg | INTRAVENOUS | Status: DC | PRN
  Administered 2018-07-17 (×2): 5 mg via INTRAVENOUS
  Filled 2018-07-17 (×2): qty 5

## 2018-07-17 MED ORDER — METOPROLOL TARTRATE 5 MG/5ML IV SOLN
INTRAVENOUS | Status: AC
  Administered 2018-07-17: 5 mg
  Filled 2018-07-17: qty 10

## 2018-07-17 MED ORDER — NITROGLYCERIN 0.4 MG SL SUBL
SUBLINGUAL_TABLET | SUBLINGUAL | Status: AC
  Filled 2018-07-17: qty 2

## 2018-07-17 MED ORDER — NITROGLYCERIN 0.4 MG SL SUBL
0.8000 mg | SUBLINGUAL_TABLET | Freq: Once | SUBLINGUAL | Status: AC
  Administered 2018-07-17: 0.8 mg via SUBLINGUAL
  Filled 2018-07-17: qty 25

## 2018-07-17 NOTE — Research (Signed)
CADFEM Informed Consent                  Subject Name:   Maureen Smith   Subject met inclusion and exclusion criteria.  The informed consent form, study requirements and expectations were reviewed with the subject and questions and concerns were addressed prior to the signing of the consent form.  The subject verbalized understanding of the trial requirements.  The subject agreed to participate in the CADFEM trial and signed the informed consent.  The informed consent was obtained prior to performance of any protocol-specific procedures for the subject.  A copy of the signed informed consent was given to the subject and a copy was placed in the subject's medical record.   [place] Santo Zahradnik, Research Assistant  07/17/2018 06:43 a.m.

## 2018-07-18 ENCOUNTER — Telehealth: Payer: Self-pay | Admitting: Cardiology

## 2018-07-18 NOTE — Telephone Encounter (Signed)
Patient returning call for results 

## 2018-07-18 NOTE — Telephone Encounter (Signed)
Called patient, advised of CT results. Will notify nurse.

## 2018-10-20 ENCOUNTER — Telehealth: Payer: Self-pay | Admitting: Diagnostic Neuroimaging

## 2018-10-20 NOTE — Telephone Encounter (Signed)
°  Due to current COVID 19 pandemic, our office is severely reducing in office visits until further notice, in order to minimize the risk to our patients and healthcare providers.    Called patient and scheduled a virtual visit with Dr. Leta Baptist for 6/3. Patient verbalized understanding of the [email or web address] process and I have sent an e-mail to [email or web address] with link and instructions as well as my name and our office number. Patient understands that they will receive a call from RN to update chart.   Pt understands that although there may be some limitations with this type of visit, we will take all precautions to reduce any security or privacy concerns.  Pt understands that this will be treated like an in office visit and we will file with pt's insurance, and there may be a patient responsible charge related to this service.

## 2018-10-21 ENCOUNTER — Encounter: Payer: Self-pay | Admitting: *Deleted

## 2018-10-21 ENCOUNTER — Encounter: Payer: Self-pay | Admitting: Diagnostic Neuroimaging

## 2018-10-21 NOTE — Telephone Encounter (Addendum)
LVM requesting call back to update EMR. Chart updated with referring provider's notes dated 10/15/18.

## 2018-10-22 ENCOUNTER — Other Ambulatory Visit: Payer: Self-pay

## 2018-10-22 ENCOUNTER — Encounter: Payer: Self-pay | Admitting: Diagnostic Neuroimaging

## 2018-10-22 ENCOUNTER — Ambulatory Visit (INDEPENDENT_AMBULATORY_CARE_PROVIDER_SITE_OTHER): Payer: No Typology Code available for payment source | Admitting: Diagnostic Neuroimaging

## 2018-10-22 DIAGNOSIS — M62838 Other muscle spasm: Secondary | ICD-10-CM | POA: Diagnosis not present
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
## 2018-10-27 ENCOUNTER — Telehealth: Payer: Self-pay | Admitting: Diagnostic Neuroimaging

## 2018-10-27 NOTE — Telephone Encounter (Signed)
[employer]: [id number] (Exp. 10/27/18 to 04/25/19) order sent to GI. They will reach out to the patient to schedule.

## 2018-10-30 ENCOUNTER — Ambulatory Visit
Admission: RE | Admit: 2018-10-30 | Discharge: 2018-10-30 | Disposition: A | Discharge status: Home / Self Care | Payer: Commercial Managed Care - PPO | Source: Ambulatory Visit | Attending: Diagnostic Neuroimaging | Admitting: Diagnostic Neuroimaging

## 2018-10-30 DIAGNOSIS — M62838 Other muscle spasm: Secondary | ICD-10-CM

## 2018-10-30 MED ORDER — GADOBENATE DIMEGLUMINE 529 MG/ML IV SOLN
14.0000 mL | Freq: Once | INTRAVENOUS | Status: AC | PRN
  Administered 2018-10-30: 14 mL via INTRAVENOUS

## 2018-11-04 ENCOUNTER — Telehealth: Payer: Self-pay | Admitting: *Deleted

## 2018-11-04 DIAGNOSIS — M62838 Other muscle spasm: Secondary | ICD-10-CM

## 2018-11-04 NOTE — Telephone Encounter (Signed)
LVM informing patient her MRI brain result is normal. Left number for any questions.

## 2018-11-05 NOTE — Telephone Encounter (Signed)
I recommend EMG/NCS as next step, instead of office visit. -VRP

## 2018-11-05 NOTE — Telephone Encounter (Signed)
LVM requesting call back to discuss in office visit scheduled next Tues morning. I advised her I wasn't clear if appt is needed because her MRI was normal. Also advised her Dr Maureen Smith is seeing very limited number of new patients in office only on Tues afternoons. Left number for her to call back and discuss.

## 2018-11-05 NOTE — Addendum Note (Signed)
Addended by: Minna Antis on: 11/05/2018 04:05 PM   Modules accepted: Orders

## 2018-11-05 NOTE — Telephone Encounter (Signed)
LVM informing the patient that Dr Leta Baptist has ordered a NCS to be done. He recommends this test instead of an office visit at this time. I advised her it will be done in our office, and he does NCS on Thurs afternoons. I advised the technician, Elmyra Ricks will call her to schedule to test. I left number for any questions.

## 2018-11-05 NOTE — Telephone Encounter (Signed)
Received call back from patient and dicussed her in office visit next Tues. I advised her Dr Leta Baptist isn't in the office on Tues mornings. She stated that she wants to know what is next, that her MRI was negative but what is causing her symptoms. She consented to another video visit and to leave her appointment in place. I confirmed her e mail: [email or web address] and advised she'll get a new e mail today. She  verbalized understanding, appreciation. E mail sent.

## 2018-11-11 ENCOUNTER — Ambulatory Visit: Payer: No Typology Code available for payment source | Admitting: Diagnostic Neuroimaging

## 2018-11-26 HISTORY — PX: OTHER SURGICAL HISTORY: SHX169

## 2018-12-04 ENCOUNTER — Telehealth: Payer: Self-pay | Admitting: Diagnostic Neuroimaging

## 2018-12-04 ENCOUNTER — Encounter: Payer: No Typology Code available for payment source | Admitting: Diagnostic Neuroimaging

## 2018-12-04 NOTE — Telephone Encounter (Signed)
Pt called in and wants to know if she can be seen sooner than September for her test, if not she wants to know if she can be seen by another doctor.

## 2018-12-09 NOTE — Telephone Encounter (Signed)
I called the patient. I advised patient that all of our providers are scheduling out to September for NCV/EMG's due to [place]. Advised patient that many patients were cancelled and rescheduled due to our office being closed for a few months. I checked Dr. Jannifer Franklin' schedule. His first available is 9/1. I advised patient that it would be best to keep her 9/3 appointment with her provider. I added patient to wait list.

## 2018-12-09 NOTE — Telephone Encounter (Signed)
I checked with Dr. Jannifer Franklin and he would be happy to help and do this test in Dr. Gladstone Lighter place. Please call patient and see if any of Dr. Tobey Grim available times works for her. Thanks!

## 2019-01-02 ENCOUNTER — Other Ambulatory Visit: Payer: Self-pay

## 2019-01-06 ENCOUNTER — Other Ambulatory Visit: Payer: Self-pay

## 2019-01-06 ENCOUNTER — Encounter: Payer: Self-pay | Admitting: Internal Medicine

## 2019-01-06 ENCOUNTER — Ambulatory Visit (INDEPENDENT_AMBULATORY_CARE_PROVIDER_SITE_OTHER): Payer: No Typology Code available for payment source | Admitting: Internal Medicine

## 2019-01-06 VITALS — BP 106/78 | HR 86 | Temp 98.5°F | Ht 61.0 in | Wt 149.2 lb

## 2019-01-06 DIAGNOSIS — C73 Malignant neoplasm of thyroid gland: Secondary | ICD-10-CM

## 2019-01-06 DIAGNOSIS — E876 Hypokalemia: Secondary | ICD-10-CM

## 2019-01-06 LAB — BASIC METABOLIC PANEL
BUN: 12 mg/dL (ref 6–23)
CO2: 29 mEq/L (ref 19–32)
Calcium: 9.2 mg/dL (ref 8.4–10.5)
Chloride: 104 mEq/L (ref 96–112)
Creatinine, Ser: 0.77 mg/dL (ref 0.40–1.20)
GFR: 95.65 mL/min (ref >60.00)
Glucose, Bld: 115 mg/dL — ABNORMAL HIGH (ref 70–99)
Potassium: 3.4 mEq/L — ABNORMAL LOW (ref 3.5–5.1)
Sodium: 140 mEq/L (ref 135–145)

## 2019-01-06 LAB — T4, FREE: Free T4: 0.8 ng/dL (ref 0.60–1.60)

## 2019-01-06 LAB — TSH: TSH: 2.52 u[IU]/mL (ref 0.35–4.50)

## 2019-01-06 MED ORDER — POTASSIUM CHLORIDE ER 10 MEQ PO TBCR: EXTENDED_RELEASE_TABLET | ORAL | 0 refills | Status: DC

## 2019-01-06 NOTE — Patient Instructions (Signed)
-   Please proceed with right lobe resection.  - Please schedule an appointment to see me 6 weeks after your surgery - Please stop by the lab today, will contact you with the results
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
## 2019-01-09 ENCOUNTER — Emergency Department (HOSPITAL_COMMUNITY): Payer: No Typology Code available for payment source

## 2019-01-09 ENCOUNTER — Other Ambulatory Visit: Payer: Self-pay

## 2019-01-09 ENCOUNTER — Encounter (HOSPITAL_COMMUNITY): Payer: Self-pay | Admitting: *Deleted

## 2019-01-09 ENCOUNTER — Emergency Department (HOSPITAL_COMMUNITY)
Admission: EM | Admit: 2019-01-09 | Discharge: 2019-01-09 | Disposition: A | Discharge status: Home / Self Care | Payer: No Typology Code available for payment source | Attending: Emergency Medicine | Admitting: Emergency Medicine

## 2019-01-09 DIAGNOSIS — I10 Essential (primary) hypertension: Secondary | ICD-10-CM | POA: Diagnosis not present

## 2019-01-09 DIAGNOSIS — R079 Chest pain, unspecified: Secondary | ICD-10-CM | POA: Diagnosis present

## 2019-01-09 DIAGNOSIS — J45909 Unspecified asthma, uncomplicated: Secondary | ICD-10-CM | POA: Diagnosis not present

## 2019-01-09 LAB — TROPONIN I (HIGH SENSITIVITY)
Troponin I (High Sensitivity): 3 ng/L (ref <18)
Troponin I (High Sensitivity): 4 ng/L (ref <18)

## 2019-01-09 LAB — CBC
HCT: 42.7 % (ref 36.0–46.0)
Hemoglobin: 13.5 g/dL (ref 12.0–15.0)
MCH: 27.1 pg (ref 26.0–34.0)
MCHC: 31.6 g/dL (ref 30.0–36.0)
MCV: 85.6 fL (ref 80.0–100.0)
Platelets: 206 10*3/uL (ref 150–400)
RBC: 4.99 MIL/uL (ref 3.87–5.11)
RDW: 14.8 % (ref 11.5–15.5)
WBC: 5.6 10*3/uL (ref 4.0–10.5)
nRBC: 0 % (ref 0.0–0.2)

## 2019-01-09 LAB — BASIC METABOLIC PANEL
Anion gap: 11 (ref 5–15)
BUN: 15 mg/dL (ref 6–20)
CO2: 22 mmol/L (ref 22–32)
Calcium: 9.1 mg/dL (ref 8.9–10.3)
Chloride: 107 mmol/L (ref 98–111)
Creatinine, Ser: 0.81 mg/dL (ref 0.44–1.00)
GFR calc Af Amer: >60 mL/min (ref >60)
GFR calc non Af Amer: >60 mL/min (ref >60)
Glucose, Bld: 130 mg/dL — ABNORMAL HIGH (ref 70–99)
Potassium: 3.7 mmol/L (ref 3.5–5.1)
Sodium: 140 mmol/L (ref 135–145)

## 2019-01-09 MED ORDER — SODIUM CHLORIDE 0.9% FLUSH
3.0000 mL | Freq: Once | INTRAVENOUS | Status: AC
  Administered 2019-01-09: 3 mL via INTRAVENOUS

## 2019-01-09 MED ORDER — ASPIRIN 81 MG PO CHEW
324.0000 mg | CHEWABLE_TABLET | Freq: Once | ORAL | Status: AC
  Administered 2019-01-09: 09:00:00 324 mg via ORAL
  Filled 2019-01-09: qty 4

## 2019-01-09 NOTE — ED Triage Notes (Signed)
Right sided chest pain and central chest pain that awoke her from her sleep this morning. Describes as "pressure" Denies SOB, dizziness. Some nausea. "feels like real bad indigestion"
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
## 2019-01-09 NOTE — Discharge Instructions (Addendum)
You were evaluated in the Emergency Department and after careful evaluation, we did not find any emergent condition requiring admission or further testing in the hospital.  Your testing today was reassuring with no signs of heart damage.  However given your abnormal stress test result in the past, we do recommend very close follow-up with your primary care doctor or cardiologist within the next 1 to 2 weeks.  Please return to the Emergency Department if you experience any worsening of your condition.  We encourage you to follow up with a primary care provider.  Thank you for allowing [place] to be a part of your care.

## 2019-01-13 ENCOUNTER — Ambulatory Visit (INDEPENDENT_AMBULATORY_CARE_PROVIDER_SITE_OTHER): Payer: No Typology Code available for payment source | Admitting: Neurology

## 2019-01-13 ENCOUNTER — Encounter: Payer: Self-pay | Admitting: Neurology

## 2019-01-13 ENCOUNTER — Other Ambulatory Visit: Payer: Self-pay

## 2019-01-13 ENCOUNTER — Encounter

## 2019-01-13 ENCOUNTER — Telehealth: Payer: Self-pay | Admitting: Internal Medicine

## 2019-01-13 DIAGNOSIS — R253 Fasciculation: Secondary | ICD-10-CM | POA: Diagnosis not present

## 2019-01-13 DIAGNOSIS — M62838 Other muscle spasm: Secondary | ICD-10-CM

## 2019-01-13 HISTORY — DX: Fasciculation: R25.3

## 2019-01-13 NOTE — Telephone Encounter (Signed)
Pt stated that she is always exhausted and can not get out of bed some days and that these symptoms are related to what she sees you for and that it will qualify for you to fill out her FMLA paperwork, please advise.

## 2019-01-13 NOTE — Progress Notes (Signed)
Please refer to EMG and nerve conduction procedure note.  

## 2019-01-13 NOTE — Telephone Encounter (Signed)
Paperwork placed on your desk for review

## 2019-01-13 NOTE — Telephone Encounter (Signed)
lft vm for pt with this info

## 2019-01-13 NOTE — Telephone Encounter (Signed)
Pt came in and dropped off some paperwork that was requested by Dr Kelton Pillar. Pt also stated she needs FMLA form. Please advise? Thank you!  Please call pt @ [phone].

## 2019-01-13 NOTE — Procedures (Signed)
     HISTORY:  Maureen Smith is a 53 year old patient with a history of migratory muscle twitches that have been present for many months.  She reports some occasional tingling sensations in the extremities as well.  She will sometimes have muscle cramps.  She is being evaluated for these issues.  She reports no weakness or atrophy of the muscle.  NERVE CONDUCTION STUDIES:  Nerve conduction studies were performed on the right upper extremity. The distal motor latencies and motor amplitudes for the median and ulnar nerves were within normal limits. The nerve conduction velocities for these nerves were also normal. The sensory latencies for the median, radial, and ulnar nerves were normal. The F wave latency for the ulnar nerve was within normal limits.  Nerve conduction studies were performed on the right lower extremity. The distal motor latencies and motor amplitudes for the peroneal and posterior tibial nerves were within normal limits. The nerve conduction velocities for these nerves were also normal. The sensory latencies for the peroneal and sural nerves were within normal limits. The F wave latency for the posterior tibial nerve was within normal limits.   EMG STUDIES:  EMG study was performed on the right lower extremity:  The tibialis anterior muscle reveals 2 to 4K motor units with full recruitment. No fibrillations or positive waves were seen. The peroneus tertius muscle reveals 2 to 4K motor units with full recruitment. No fibrillations or positive waves were seen. The medial gastrocnemius muscle reveals 1 to 3K motor units with full recruitment. No fibrillations or positive waves were seen. The vastus lateralis muscle reveals 2 to 4K motor units with full recruitment. No fibrillations or positive waves were seen. The iliopsoas muscle reveals 2 to 4K motor units with full recruitment. No fibrillations or positive waves were seen. The biceps femoris muscle (long head) reveals 2 to 4K  motor units with full recruitment. No fibrillations or positive waves were seen. The lumbosacral paraspinal muscles were tested at 3 levels, and revealed no abnormalities of insertional activity at all 3 levels tested. There was good relaxation.   IMPRESSION:  Nerve conduction studies done on the right upper and right lower extremities were unremarkable, no evidence of neuropathy is seen.  EMG evaluation of the right lower extremity was unremarkable, no evidence of a myopathic disorder or a radiculopathy is seen.  Jill Alexanders MD 01/13/2019 9:43 AM  [facility] [address] [place], [place] [address]  Phone [phone] Fax [phone]

## 2019-01-13 NOTE — Telephone Encounter (Signed)
lft vm to return call 

## 2019-01-14 NOTE — Progress Notes (Signed)
[place]    Nerve / Sites Muscle Latency Ref. Amplitude Ref. Rel Amp Segments Distance Velocity Ref. Area    ms ms mV mV %  cm m/s m/s mVms  R Median - APB     Wrist APB 3.2 ?4.4 10.7 ?4.0 100 Wrist - APB 7   29.3     Upper arm APB 6.5  9.8  91.8 Upper arm - Wrist 19 57 ?49 29.6  R Ulnar - ADM     Wrist ADM 2.3 ?3.3 12.9 ?6.0 100 Wrist - ADM 7   36.1     B.Elbow ADM 4.7  11.9  92 B.Elbow - Wrist 15 63 ?49 34.6     A.Elbow ADM 6.5  10.6  89.5 A.Elbow - B.Elbow 10 55 ?49 31.1         A.Elbow - Wrist      R Peroneal - EDB     Ankle EDB 5.1 ?6.5 4.0 ?2.0 100 Ankle - EDB 9   14.3     Fib head EDB 10.4  3.8  95.5 Fib head - Ankle 27 51 ?44 14.6     Pop fossa EDB 12.3  3.4  89.9 Pop fossa - Fib head 10 51 ?44 12.7         Pop fossa - Ankle      R Tibial - AH     Ankle AH 3.8 ?5.8 13.8 ?4.0 100 Ankle - AH 9   24.5     Pop fossa AH 10.5  11.5  83.2 Pop fossa - Ankle 32 48 ?41 21.6             SNC    Nerve / Sites Rec. Site Peak Lat Ref.  Amp Ref. Segments Distance    ms ms V V  cm  R Radial - Anatomical snuff box (Forearm)     Forearm Wrist 2.2 ?2.9 40 ?15 Forearm - Wrist 10  R Sural - Ankle (Calf)     Calf Ankle 2.9 ?4.4 32 ?6 Calf - Ankle 14  R Superficial peroneal - Ankle     Lat leg Ankle 3.6 ?4.4 15 ?6 Lat leg - Ankle 14  R Median - Orthodromic (Dig II, Mid palm)     Dig II Wrist 2.7 ?3.4 17 ?10 Dig II - Wrist 13  R Ulnar - Orthodromic, (Dig V, Mid palm)     Dig V Wrist 2.5 ?3.1 11 ?5 Dig V - Wrist 1               F  Wave    Nerve F Lat Ref.   ms ms  R Tibial - AH 41.9 ?56.0  R Ulnar - ADM 24.5 ?32.0

## 2019-01-16 ENCOUNTER — Other Ambulatory Visit: Payer: Self-pay | Admitting: Internal Medicine

## 2019-01-22 ENCOUNTER — Encounter: Payer: No Typology Code available for payment source | Admitting: Diagnostic Neuroimaging

## 2019-03-10 ENCOUNTER — Telehealth: Payer: Self-pay | Admitting: Cardiology

## 2019-03-10 NOTE — Telephone Encounter (Signed)
° °  [facility] Pre-operative Risk Assessment    Request for surgical clearance:  1. What type of surgery is being performed? Complete Thyroid Removal  2. When is this surgery scheduled? 03-16-19  3. What type of clearance is required (medical clearance vs. Pharmacy clearance to hold med vs. Both)?  Medical  4. Are there any medications that need to be held prior to surgery and how long?  5. Practice name and name of physician performing surgery? Dr. Cam Hai   6. What is your office phone number: [phone] Pre-op Anesthesia Dept    7.   What is your office fax number: [phone]  8.   Anesthesia type (None, local, MAC, general) ? General  Pt saw Dr. Harrell Gave on 06/26/18. The patient had a partial removal of her thyroid , but the cancer has spread, so the patient will require total removal   Maureen Smith 03/10/2019, 3:22 PM  _________________________________________________________________   (provider comments below)

## 2019-03-10 NOTE — Telephone Encounter (Signed)
I left a message for pt to call the office to schedule an appt for surgery clearance. I then s/w the Pre Op Anesthesia Dept in Dr. Cam Hai office and advised pt will need appt with cardiologist be fore being cleared. Pt is currently scheduled for surgery 03/16/19 with Dr. Scarlette Slice. I will send note to Dr. Scarlette Slice as Juluis Rainier.

## 2019-03-10 NOTE — Telephone Encounter (Signed)
Pt scheduled to see Kerin Ransom 10/21 for surgery clearance. I will route surgery information to PA for appt. I will remove from the pre op call back pool.

## 2019-03-10 NOTE — Telephone Encounter (Signed)
   Primary Cardiologist:Bridgette Harrell Gave, MD  Chart reviewed as part of pre-operative protocol coverage. Because of Cornell Brucker past medical history and time since last visit, he/she will require a follow-up visit in order to better assess preoperative cardiovascular risk.  Pre-op covering staff: - Please schedule appointment and call patient to inform them. - Please contact requesting surgeon's office via preferred method (i.e, phone, fax) to inform them of need for appointment prior to surgery.  If applicable, this message will also be routed to pharmacy pool and/or primary cardiologist for input on holding anticoagulant/antiplatelet agent as requested below so that this information is available at time of patient's appointment.   Kathyrn Drown, NP  03/10/2019, 3:40 PM

## 2019-03-11 ENCOUNTER — Other Ambulatory Visit: Payer: Self-pay

## 2019-03-11 ENCOUNTER — Encounter: Payer: Self-pay | Admitting: Cardiology

## 2019-03-11 ENCOUNTER — Ambulatory Visit (INDEPENDENT_AMBULATORY_CARE_PROVIDER_SITE_OTHER): Payer: Commercial Managed Care - PPO | Admitting: Cardiology

## 2019-03-11 DIAGNOSIS — Z87898 Personal history of other specified conditions: Secondary | ICD-10-CM

## 2019-03-11 DIAGNOSIS — I4510 Unspecified right bundle-branch block: Secondary | ICD-10-CM

## 2019-03-11 DIAGNOSIS — Z01818 Encounter for other preprocedural examination: Secondary | ICD-10-CM | POA: Insufficient documentation
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
## 2019-03-11 NOTE — Assessment & Plan Note (Addendum)
Felt to be non cardiac Abnormal GXT with normal Myoview Jan 2020 Coronary CTA Feb 2020- no CAD, Ca++ score 0

## 2019-03-11 NOTE — Telephone Encounter (Signed)
   Primary Cardiologist: Buford Dresser, MD  Chart reviewed and patient seen and examined in the office today as part of pre-operative protocol coverage. Given past medical history and time since last visit, based on ACC/AHA guidelines, Rihannah Vince would be at acceptable risk for the planned procedure without further cardiovascular testing.   I will route this recommendation to the requesting party via Epic fax function and remove from pre-op pool.  Please call with questions.  Kerin Ransom, PA-C 03/11/2019, 3:24 PM

## 2019-03-11 NOTE — Patient Instructions (Signed)
Medication Instructions:  Your physician recommends that you continue on your current medications as directed. Please refer to the Current Medication list given to you today. *If you need a refill on your cardiac medications before your next appointment, please call your pharmacy*  Lab Work: None  If you have labs (blood work) drawn today and your tests are completely normal, you will receive your results only by: Marland Kitchen MyChart Message (if you have MyChart) OR . A paper copy in the mail If you have any lab test that is abnormal or we need to change your treatment, we will call you to review the results.  Testing/Procedures: None   Follow-Up: At [facility], you and your health needs are our priority.  As part of our continuing mission to provide you with exceptional heart care, we have created designated Provider Care Teams.  These Care Teams include your primary Cardiologist (physician) and Advanced Practice Providers (APPs -  Physician Assistants and Nurse Practitioners) who all work together to provide you with the care you need, when you need it.  Your next appointment:   4 months  The format for your next appointment:   In Person  Provider:   Buford Dresser, MD  Other Instructions YOU ARE [place]

## 2019-03-11 NOTE — Assessment & Plan Note (Signed)
Chronic. 

## 2019-03-11 NOTE — Assessment & Plan Note (Signed)
Pt is an acceptable risk froma  Cardiac standpoint for proposed surgery without further cardiac testing.  official clearance will be faxed via Epic.
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
## 2019-03-31 ENCOUNTER — Ambulatory Visit (INDEPENDENT_AMBULATORY_CARE_PROVIDER_SITE_OTHER): Payer: Commercial Managed Care - PPO | Admitting: Internal Medicine

## 2019-03-31 ENCOUNTER — Encounter: Payer: Self-pay | Admitting: Internal Medicine

## 2019-03-31 ENCOUNTER — Other Ambulatory Visit: Payer: Self-pay

## 2019-03-31 VITALS — BP 120/76 | HR 102 | Ht 61.0 in | Wt 151.5 lb

## 2019-03-31 DIAGNOSIS — C73 Malignant neoplasm of thyroid gland: Secondary | ICD-10-CM | POA: Insufficient documentation

## 2019-03-31 DIAGNOSIS — I10 Essential (primary) hypertension: Secondary | ICD-10-CM | POA: Diagnosis not present

## 2019-03-31 DIAGNOSIS — E890 Postprocedural hypothyroidism: Secondary | ICD-10-CM | POA: Insufficient documentation

## 2019-03-31 DIAGNOSIS — E876 Hypokalemia: Secondary | ICD-10-CM | POA: Insufficient documentation

## 2019-03-31 NOTE — Patient Instructions (Addendum)
You are on levothyroxine - which is your thyroid hormone supplement. You MUST take this consistently.  You should take this first thing in the morning on an empty stomach with water. You should not take it with other medications. Wait 72min to 1hr prior to eating. If you are taking any vitamins - please take these in the evening.   If you miss a dose, please take your missed dose the following day (double the dose for that day). You should have a pill box for ONLY levothyroxine on your bedside table to help you remember to take your medications.   - On your next visit please bring the updated pathology report from the [place] and also the  Pathology report from [facility] .   - Do not worry about your aldosterone at this time, continue spironolactone at this time

## 2019-04-21 ENCOUNTER — Ambulatory Visit: Payer: No Typology Code available for payment source | Admitting: Diagnostic Neuroimaging

## 2019-04-28 ENCOUNTER — Ambulatory Visit: Payer: Commercial Managed Care - PPO | Admitting: Internal Medicine

## 2019-05-20 ENCOUNTER — Other Ambulatory Visit: Payer: Self-pay

## 2019-05-21 ENCOUNTER — Ambulatory Visit (INDEPENDENT_AMBULATORY_CARE_PROVIDER_SITE_OTHER): Payer: Commercial Managed Care - PPO | Admitting: Internal Medicine

## 2019-05-21 ENCOUNTER — Encounter: Payer: Self-pay | Admitting: Internal Medicine

## 2019-05-21 DIAGNOSIS — C73 Malignant neoplasm of thyroid gland: Secondary | ICD-10-CM

## 2019-05-21 DIAGNOSIS — E890 Postprocedural hypothyroidism: Secondary | ICD-10-CM | POA: Diagnosis not present

## 2019-05-21 DIAGNOSIS — I10 Essential (primary) hypertension: Secondary | ICD-10-CM

## 2019-05-21 DIAGNOSIS — R253 Fasciculation: Secondary | ICD-10-CM | POA: Diagnosis not present

## 2019-05-21 DIAGNOSIS — N644 Mastodynia: Secondary | ICD-10-CM

## 2019-05-21 LAB — T4, FREE: Free T4: 1.07 ng/dL (ref 0.60–1.60)

## 2019-05-21 LAB — TSH: TSH: 0.02 u[IU]/mL — ABNORMAL LOW (ref 0.35–4.50)

## 2019-05-21 LAB — BASIC METABOLIC PANEL
BUN: 11 mg/dL (ref 6–23)
CO2: 27 mEq/L (ref 19–32)
Calcium: 9.4 mg/dL (ref 8.4–10.5)
Chloride: 102 mEq/L (ref 96–112)
Creatinine, Ser: 0.7 mg/dL (ref 0.40–1.20)
GFR: 106.62 mL/min (ref >60.00)
Glucose, Bld: 105 mg/dL — ABNORMAL HIGH (ref 70–99)
Potassium: 4.2 mEq/L (ref 3.5–5.1)
Sodium: 136 mEq/L (ref 135–145)

## 2019-05-21 LAB — MAGNESIUM: Magnesium: 1.7 mg/dL (ref 1.5–2.5)

## 2019-05-21 LAB — PHOSPHORUS: Phosphorus: 3.2 mg/dL (ref 2.3–4.6)

## 2019-05-21 NOTE — Patient Instructions (Signed)
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
## 2019-05-25 LAB — PROLACTIN: Prolactin: 16.8 ng/mL

## 2019-05-25 LAB — THYROGLOBULIN ANTIBODY: Thyroglobulin Ab: <1 IU/mL (ref <=1)

## 2019-05-25 LAB — THYROGLOBULIN LEVEL: Thyroglobulin: 0.3 ng/mL — ABNORMAL LOW

## 2019-08-24 ENCOUNTER — Ambulatory Visit: Payer: Commercial Managed Care - PPO | Admitting: Internal Medicine

## 2019-09-07 ENCOUNTER — Other Ambulatory Visit: Payer: Self-pay

## 2019-09-09 ENCOUNTER — Encounter: Payer: Self-pay | Admitting: Internal Medicine

## 2019-09-09 ENCOUNTER — Ambulatory Visit (INDEPENDENT_AMBULATORY_CARE_PROVIDER_SITE_OTHER): Payer: Commercial Managed Care - PPO | Admitting: Internal Medicine

## 2019-09-09 ENCOUNTER — Other Ambulatory Visit: Payer: Self-pay

## 2019-09-09 VITALS — BP 116/78 | HR 87 | Temp 98.5°F | Ht 61.0 in | Wt 158.8 lb

## 2019-09-09 DIAGNOSIS — C73 Malignant neoplasm of thyroid gland: Secondary | ICD-10-CM

## 2019-09-09 DIAGNOSIS — E890 Postprocedural hypothyroidism: Secondary | ICD-10-CM | POA: Diagnosis not present

## 2019-09-09 DIAGNOSIS — I10 Essential (primary) hypertension: Secondary | ICD-10-CM

## 2019-09-09 LAB — BASIC METABOLIC PANEL
BUN: 15 mg/dL (ref 6–23)
CO2: 26 mEq/L (ref 19–32)
Calcium: 9.2 mg/dL (ref 8.4–10.5)
Chloride: 102 mEq/L (ref 96–112)
Creatinine, Ser: 0.77 mg/dL (ref 0.40–1.20)
GFR: 95.4 mL/min (ref >60.00)
Glucose, Bld: 108 mg/dL — ABNORMAL HIGH (ref 70–99)
Potassium: 4.2 mEq/L (ref 3.5–5.1)
Sodium: 135 mEq/L (ref 135–145)

## 2019-09-09 LAB — TSH: TSH: 0.26 u[IU]/mL — ABNORMAL LOW (ref 0.35–4.50)

## 2019-09-09 LAB — T4, FREE: Free T4: 0.9 ng/dL (ref 0.60–1.60)

## 2019-09-09 NOTE — Patient Instructions (Signed)
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
## 2019-09-10 ENCOUNTER — Other Ambulatory Visit: Payer: Self-pay

## 2019-09-10 ENCOUNTER — Ambulatory Visit
Admission: RE | Admit: 2019-09-10 | Discharge: 2019-09-10 | Disposition: A | Discharge status: Home / Self Care | Payer: Commercial Managed Care - PPO | Source: Ambulatory Visit | Attending: Internal Medicine | Admitting: Internal Medicine

## 2019-09-10 ENCOUNTER — Encounter: Payer: Self-pay | Admitting: Internal Medicine

## 2019-09-10 ENCOUNTER — Telehealth: Payer: Self-pay | Admitting: Diagnostic Neuroimaging

## 2019-09-10 DIAGNOSIS — C73 Malignant neoplasm of thyroid gland: Secondary | ICD-10-CM

## 2019-09-10 LAB — THYROGLOBULIN LEVEL: Thyroglobulin: 0.6 ng/mL — ABNORMAL LOW

## 2019-09-10 LAB — THYROGLOBULIN ANTIBODY: Thyroglobulin Ab: <1 IU/mL (ref <=1)

## 2019-09-10 MED ORDER — ATORVASTATIN CALCIUM 40 MG PO TABS: 40.0000 mg | ORAL_TABLET | Freq: Every day | ORAL | 3 refills | Status: AC

## 2019-09-10 NOTE — Telephone Encounter (Signed)
Pt called stating that she has been having uncontrollable muscle movements and she is wanting to know if this is something that can be discussed on a Virtual Visit. Pt states that a message with the answer can be left on her VM if she does not pick up. Please advise.

## 2019-09-14 ENCOUNTER — Telehealth: Payer: Self-pay | Admitting: Internal Medicine

## 2019-09-14 DIAGNOSIS — C73 Malignant neoplasm of thyroid gland: Secondary | ICD-10-CM

## 2019-09-14 NOTE — Telephone Encounter (Signed)
Discussed thyroid ultrasound results as below with the patient on 09/14/2019 at 1330   Thyroid ultrasound (09/10/2019)  Right lobe: Surgically absent. There is no residual nodular soft tissue within the right lobectomy resection bed.  Left lobe: There is ill-defined soft tissue within the left thyroidectomy resection bed measuring approximately 1.2 x 0.7 x 0.6 cm.  _________________________________________________________  Note is made of mildly prominent though non pathologically enlarged high bilateral cervical lymph nodes with index bilateral cervical lymph nodes each measuring 0.9 cm in greatest short axis diameter (left - image 25; right - image 28), presumably reactive in etiology. No definitive regional cervical lymphadenopathy.  IMPRESSION: 1. Ill-defined soft tissue within the left thyroidectomy resection bed measures 1.2 cm in diameter potentially residual thyroid parenchyma versus residual/recurrent disease. Further evaluation with the acquisition of thyroglobulin levels could be performed as indicated. 2. Mildly prominent though non pathologically enlarged bilateral high cervical lymph nodes, nonspecific though presumably reactive in etiology.     -This is the first ultrasound that we have postoperatively, there is a remnant thyroid tissue, which is not uncommon in the circumstances. -I discussed the plan of repeating her TG and TG AB in 3 months, if this continues to trend up we will discuss further management plans.     Patient in agreement of this   [place], MD  [facility] Endocrinology  [facility] [address] [place], [place] [address] Phone: [phone] FAX: [phone]

## 2019-09-14 NOTE — Telephone Encounter (Signed)
Please advise 

## 2019-09-14 NOTE — Telephone Encounter (Signed)
Patient would like results from ultrasound

## 2019-12-16 ENCOUNTER — Other Ambulatory Visit (INDEPENDENT_AMBULATORY_CARE_PROVIDER_SITE_OTHER): Payer: Commercial Managed Care - PPO

## 2019-12-16 ENCOUNTER — Other Ambulatory Visit: Payer: Self-pay

## 2019-12-16 DIAGNOSIS — C73 Malignant neoplasm of thyroid gland: Secondary | ICD-10-CM | POA: Diagnosis not present

## 2019-12-16 LAB — T4, FREE: Free T4: 0.81 ng/dL (ref 0.60–1.60)

## 2019-12-16 LAB — TSH: TSH: 0.3 u[IU]/mL — ABNORMAL LOW (ref 0.35–4.50)

## 2019-12-17 LAB — THYROGLOBULIN LEVEL: Thyroglobulin: 0.5 ng/mL — ABNORMAL LOW

## 2019-12-17 LAB — THYROGLOBULIN ANTIBODY: Thyroglobulin Ab: <1 IU/mL (ref <=1)

## 2020-01-12 ENCOUNTER — Telehealth: Payer: Self-pay | Admitting: Diagnostic Neuroimaging

## 2020-01-12 NOTE — Telephone Encounter (Signed)
..   Pt understands that although there may be some limitations with this type of visit, we will take all precautions to reduce any security or privacy concerns.  Pt understands that this will be treated like an in office visit and we will file with pt's insurance, and there may be a patient responsible charge related to this service. ? ?

## 2020-01-13 ENCOUNTER — Encounter: Payer: Self-pay | Admitting: Neurology

## 2020-01-13 ENCOUNTER — Telehealth (INDEPENDENT_AMBULATORY_CARE_PROVIDER_SITE_OTHER): Payer: No Typology Code available for payment source | Admitting: Neurology

## 2020-01-13 DIAGNOSIS — R253 Fasciculation: Secondary | ICD-10-CM

## 2020-01-13 NOTE — Progress Notes (Signed)
    Virtual Visit via Video Note  I connected with Maureen Smith on 01/13/20 at  2:15 PM EDT by a video enabled telemedicine application and verified that I am speaking with the correct person using two identifiers.  Location: Patient: at her home Provider: in the office    I discussed the limitations of evaluation and management by telemedicine and the availability of in person appointments. The patient expressed understanding and agreed to proceed.  History of Present Illness: 01/13/2020 SS: Maureen Smith 54 year old female with history of muscle twitching that started around January 2020 around the right eyelid, right cheek, both arms, both legs, is now in both feet.    Nerve conduction study of the right upper and right lower extremities were unremarkable, EMG of the right lower extremity was unremarkable, no evidence of a myopathic disorder or radiculopathy is seen.  MRI of the brain with and without contrast was normal.   Continues to describe muscle twitching and spasms, becoming more frequent.  Not painful.  Sensation is quick.  Does not interfere with daily activity.  No numbness weakness, or tingling of the arms or legs.  She says the sensation will be visible.  Sees endocrinology following thyroid cancer, is on Synthroid.  Has diabetes, hypertension.  Previously saw a neurologist at the [facility] for same issue, felt to be related to anxiety.  10/22/2018 Maureen Smith: 54 year old female here for evaluation of muscle twitching.  6 to 7 months ago patient had onset of muscle twitching in her right eye, right cheek, bilateral arms, bilateral legs.  Symptoms have moved around throughout her body.  Muscle cramps and twitches lasting only a few seconds at a time.  She has had some intermittent muscle spasms and numbness and tingling sensations.  No specific triggering or aggravating factors.  Patient has had a history of Bell's palsy on the left side.   Observations/Objective: Is alert and oriented,  speech is clear and concise, facial symmetry noted, follows commands, no arm drift, gait appears steady and intact  Assessment and Plan: 1.  Muscle spasms  -Etiology has yet to be determined -MRI of the brain with and without contrast was normal -Nerve conduction/EMG was unremarkable -Follows with endocrinology, TSH slightly low 0.30, Free T4 was okay -Continue to closely monitor symptoms  Follow Up Instructions: -Can schedule follow-up in a few months with Maureen Smith if symptoms do not improve, otherwise continue to follow conservatively    I discussed the assessment and treatment plan with the patient. The patient was provided an opportunity to ask questions and all were answered. The patient agreed with the plan and demonstrated an understanding of the instructions.   The patient was advised to call back or seek an in-person evaluation if the symptoms worsen or if the condition fails to improve as anticipated.  I spent 30 minutes of face-to-face and non-face-to-face time with patient.  This included previsit chart review, lab review, study review, order entry, electronic health record documentation, patient education.  Evangeline Dakin, DNP  [facility] [address], [place], [place] [address] [phone]

## 2020-01-14 NOTE — Progress Notes (Signed)
I reviewed note and agree with plan.   Penni Bombard, MD 10/22/7996, 7:21 PM Certified in Neurology, Neurophysiology and Neuroimaging  [facility] [address] [employer],  [address] [phone]

## 2020-01-19 ENCOUNTER — Encounter: Payer: Self-pay | Admitting: Internal Medicine

## 2020-02-04 ENCOUNTER — Other Ambulatory Visit: Payer: Self-pay

## 2020-02-04 ENCOUNTER — Ambulatory Visit (INDEPENDENT_AMBULATORY_CARE_PROVIDER_SITE_OTHER): Payer: No Typology Code available for payment source | Admitting: Allergy

## 2020-02-04 ENCOUNTER — Encounter: Payer: Self-pay | Admitting: Allergy

## 2020-02-04 VITALS — BP 120/82 | HR 88 | Temp 98.1°F | Resp 16 | Ht 61.0 in | Wt 163.2 lb

## 2020-02-04 DIAGNOSIS — J3089 Other allergic rhinitis: Secondary | ICD-10-CM

## 2020-02-04 DIAGNOSIS — H1013 Acute atopic conjunctivitis, bilateral: Secondary | ICD-10-CM

## 2020-02-04 DIAGNOSIS — T7800XA Anaphylactic reaction due to unspecified food, initial encounter: Secondary | ICD-10-CM

## 2020-02-04 DIAGNOSIS — J4540 Moderate persistent asthma, uncomplicated: Secondary | ICD-10-CM

## 2020-02-04 DIAGNOSIS — T781XXD Other adverse food reactions, not elsewhere classified, subsequent encounter: Secondary | ICD-10-CM

## 2020-02-04 MED ORDER — ADVAIR HFA 230-21 MCG/ACT IN AERO: 2.0000 | INHALATION_SPRAY | Freq: Two times a day (BID) | RESPIRATORY_TRACT | 5 refills | Status: DC
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
## 2020-02-04 NOTE — Patient Instructions (Addendum)
Allergies  - skin testing today is positive to grasses, weeds, trees, molds, dust mite, cat  - allergen avoidance measures discussed/handouts provided  - continue your current medication regimen of Allegra, Flonase and Patanol  - continue nasal saline spray/rinse as needed  - allergen immunotherapy discussed today including protocol, benefits and risk.  Informational handout provided. You can schedule a new start appointment today.   Bring your epipen on days of your injections.    Asthma  - lung function today looks ok  - stop asmanex and change to Advair HFA  230/21 take 2 puffs twice a day  - have access to albuterol inhaler 2 puffs or albuterol nebulizer every 4-6 hours as needed for cough/wheeze/shortness of breath/chest tightness.  May use 15-20 minutes prior to activity.   Monitor frequency of use.     Asthma control goals:   Full participation in all desired activities (may need albuterol before activity)  Albuterol use two time or less a week on average (not counting use with activity)  Cough interfering with sleep two time or less a month  Oral steroids no more than once a year  No hospitalizations  Food allergy  - continue your current food avoidance as directed by Dr. Shelah Lewandowsky  - have access to self-injectable epinephrine Epipen 0.3mg  at all times  - follow emergency action plan in case of allergic reaction  Oral allergy syndrome - The oral allergy syndrome (OAS) or pollen-food allergy syndrome (PFAS) is a relatively common form of food allergy, particularly in adults. It typically occurs in people who have pollen allergies when the immune system "sees" proteins on the food that look like proteins on the pollen. This results in the allergy antibody (IgE) binding to the food instead of the pollen. Patients typically report itching and/or mild swelling of the mouth and throat immediately following ingestion of certain uncooked fruits (including nuts) or raw vegetables.  Only a very small number of affected individuals experience systemic allergic reactions, such as anaphylaxis which occurs with true food allergies.        Follow-up in 6 months or sooner if needed

## 2020-02-08 ENCOUNTER — Telehealth: Payer: Self-pay | Admitting: *Deleted

## 2020-02-08 NOTE — Telephone Encounter (Signed)
Received a fax from the [facility] stating that Laurel was not on formulary and the suggested alternative was Advair Diskus 250. Please advise change in inhaler. Thank You.

## 2020-02-09 MED ORDER — FLUTICASONE-SALMETEROL 250-50 MCG/DOSE IN AEPB: 1.0000 | INHALATION_SPRAY | Freq: Two times a day (BID) | RESPIRATORY_TRACT | 5 refills | Status: DC

## 2020-02-09 NOTE — Telephone Encounter (Signed)
Ok.  If she has not scheduled a new start appointment she can go ahead and do that.

## 2020-02-09 NOTE — Telephone Encounter (Signed)
Called and spoke to patient and she has been informed of the change. Patient states that she would like to go ahead with the allergy shots

## 2020-02-09 NOTE — Telephone Encounter (Signed)
Left a detailed message letting patient when she calls back to let them know she's scheduling her allergy shots.

## 2020-02-09 NOTE — Addendum Note (Signed)
Addended by: Jamelle Haring B on: 02/09/2020 10:16 AM   Modules accepted: Orders

## 2020-02-09 NOTE — Telephone Encounter (Signed)
That is fine if that is all they cover.  Advair diskuk 250 1 inhalation twice a day.

## 2020-03-10 ENCOUNTER — Encounter: Payer: Self-pay | Admitting: Internal Medicine

## 2020-03-10 ENCOUNTER — Ambulatory Visit (INDEPENDENT_AMBULATORY_CARE_PROVIDER_SITE_OTHER): Payer: Commercial Managed Care - PPO | Admitting: Internal Medicine

## 2020-03-10 ENCOUNTER — Other Ambulatory Visit: Payer: Self-pay

## 2020-03-10 VITALS — BP 122/82 | HR 82 | Temp 97.6°F | Ht 61.0 in | Wt 157.5 lb

## 2020-03-10 DIAGNOSIS — C73 Malignant neoplasm of thyroid gland: Secondary | ICD-10-CM | POA: Diagnosis not present

## 2020-03-10 DIAGNOSIS — E876 Hypokalemia: Secondary | ICD-10-CM

## 2020-03-10 DIAGNOSIS — E890 Postprocedural hypothyroidism: Secondary | ICD-10-CM

## 2020-03-10 LAB — TSH: TSH: 3.87 u[IU]/mL (ref 0.35–4.50)

## 2020-03-10 NOTE — Patient Instructions (Signed)
-   Continue Levothyroxine 125 mcg, 1 tablet daily except Sundays  - Continue Eplerenone 20 mg daily  - Continue potassium 2 capsules for now
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
## 2020-03-11 LAB — BASIC METABOLIC PANEL
BUN: 12 mg/dL (ref 6–23)
CO2: 28 mEq/L (ref 19–32)
Calcium: 9 mg/dL (ref 8.4–10.5)
Chloride: 100 mEq/L (ref 96–112)
Creatinine, Ser: 0.87 mg/dL (ref 0.40–1.20)
GFR: 80 mL/min (ref >60.00)
Glucose, Bld: 95 mg/dL (ref 70–99)
Potassium: 4.1 mEq/L (ref 3.5–5.1)
Sodium: 136 mEq/L (ref 135–145)

## 2020-03-11 LAB — THYROGLOBULIN LEVEL: Thyroglobulin: 1 ng/mL — ABNORMAL LOW

## 2020-03-11 LAB — THYROGLOBULIN ANTIBODY: Thyroglobulin Ab: <1 IU/mL (ref <=1)

## 2020-03-14 MED ORDER — EPLERENONE 50 MG PO TABS: 50.0000 mg | ORAL_TABLET | Freq: Every day | ORAL | 2 refills | Status: DC

## 2020-03-14 MED ORDER — LEVOTHYROXINE SODIUM 125 MCG PO TABS
125.0000 ug | ORAL_TABLET | Freq: Every day | ORAL | 3 refills | Status: DC
Start: 2020-03-14 — End: 2020-04-29

## 2020-03-22 ENCOUNTER — Ambulatory Visit
Admission: RE | Admit: 2020-03-22 | Discharge: 2020-03-22 | Disposition: A | Discharge status: Home / Self Care | Payer: Commercial Managed Care - PPO | Source: Ambulatory Visit | Attending: Internal Medicine | Admitting: Internal Medicine

## 2020-03-22 DIAGNOSIS — C73 Malignant neoplasm of thyroid gland: Secondary | ICD-10-CM

## 2020-03-24 ENCOUNTER — Telehealth: Payer: Self-pay | Admitting: Internal Medicine

## 2020-03-24 NOTE — Telephone Encounter (Signed)
Left a message to check portal.   Thyroid bed ultrasound showed stable thyroid root and decrease size of L.N    Will check TSH and Tg in few weeks and proceed from there    Wells, MD  [facility]  [facility] [address] [place], [place] [address] Phone: [phone] FAX: [phone]

## 2020-04-11 ENCOUNTER — Telehealth (INDEPENDENT_AMBULATORY_CARE_PROVIDER_SITE_OTHER): Payer: No Typology Code available for payment source | Admitting: Diagnostic Neuroimaging

## 2020-04-11 ENCOUNTER — Encounter: Payer: Self-pay | Admitting: Diagnostic Neuroimaging

## 2020-04-11 DIAGNOSIS — R253 Fasciculation: Secondary | ICD-10-CM
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
## 2020-04-28 ENCOUNTER — Other Ambulatory Visit (INDEPENDENT_AMBULATORY_CARE_PROVIDER_SITE_OTHER): Payer: Commercial Managed Care - PPO

## 2020-04-28 ENCOUNTER — Other Ambulatory Visit: Payer: Self-pay

## 2020-04-28 DIAGNOSIS — C73 Malignant neoplasm of thyroid gland: Secondary | ICD-10-CM

## 2020-04-28 DIAGNOSIS — E876 Hypokalemia: Secondary | ICD-10-CM | POA: Diagnosis not present

## 2020-04-28 LAB — BASIC METABOLIC PANEL
BUN: 13 mg/dL (ref 6–23)
CO2: 28 mEq/L (ref 19–32)
Calcium: 8.9 mg/dL (ref 8.4–10.5)
Chloride: 100 mEq/L (ref 96–112)
Creatinine, Ser: 0.92 mg/dL (ref 0.40–1.20)
GFR: 71.72 mL/min (ref >60.00)
Glucose, Bld: 129 mg/dL — ABNORMAL HIGH (ref 70–99)
Potassium: 3.5 mEq/L (ref 3.5–5.1)
Sodium: 137 mEq/L (ref 135–145)

## 2020-04-28 LAB — TSH: TSH: 4.31 u[IU]/mL (ref 0.35–4.50)

## 2020-04-29 ENCOUNTER — Encounter: Payer: Self-pay | Admitting: Internal Medicine

## 2020-04-29 ENCOUNTER — Telehealth: Payer: Self-pay | Admitting: Internal Medicine

## 2020-04-29 LAB — THYROGLOBULIN LEVEL: Thyroglobulin: 0.9 ng/mL — ABNORMAL LOW

## 2020-04-29 MED ORDER — LEVOTHYROXINE SODIUM 125 MCG PO TABS: 125.0000 ug | ORAL_TABLET | Freq: Every day | ORAL | 3 refills | Status: DC

## 2020-04-29 MED ORDER — LEVOTHYROXINE SODIUM 150 MCG PO TABS: 150.0000 ug | ORAL_TABLET | Freq: Every day | ORAL | 1 refills | Status: DC

## 2020-04-29 NOTE — Telephone Encounter (Signed)
TSH is still above goal at 4.3  UIU/mL  We will stop levothyroxine 125 and start levothyroxine 150 MCG daily  A portal message was sent  Abby Nena Jordan, MD  [facility] Endocrinology  [facility] [address] [place], [place] [address] Phone: [phone] FAX: [phone]

## 2020-05-30 ENCOUNTER — Other Ambulatory Visit: Payer: Self-pay | Admitting: Internal Medicine

## 2020-05-30 DIAGNOSIS — E876 Hypokalemia: Secondary | ICD-10-CM

## 2020-05-30 DIAGNOSIS — C73 Malignant neoplasm of thyroid gland: Secondary | ICD-10-CM

## 2020-05-30 DIAGNOSIS — E890 Postprocedural hypothyroidism: Secondary | ICD-10-CM

## 2020-05-30 MED ORDER — POTASSIUM CHLORIDE CRYS ER 20 MEQ PO TBCR: 20.0000 meq | EXTENDED_RELEASE_TABLET | Freq: Every day | ORAL | 1 refills | Status: DC

## 2020-05-30 MED ORDER — POTASSIUM CHLORIDE CRYS ER 20 MEQ PO TBCR: 20.0000 meq | EXTENDED_RELEASE_TABLET | Freq: Every day | ORAL | 1 refills | Status: AC

## 2020-05-30 MED ORDER — LEVOTHYROXINE SODIUM 125 MCG PO TABS: 125.0000 ug | ORAL_TABLET | Freq: Every day | ORAL | 1 refills | Status: DC

## 2020-05-30 MED ORDER — EPLERENONE 50 MG PO TABS: 50.0000 mg | ORAL_TABLET | Freq: Every day | ORAL | 1 refills | Status: DC

## 2020-05-30 MED ORDER — EPLERENONE 50 MG PO TABS: 50.0000 mg | ORAL_TABLET | Freq: Every day | ORAL | 3 refills | Status: DC

## 2020-05-30 NOTE — Telephone Encounter (Signed)
Please advise 

## 2020-05-30 NOTE — Telephone Encounter (Signed)
Per DPR, left detail message for patient that Rx have been printed and left in the front office.

## 2020-05-30 NOTE — Telephone Encounter (Signed)
Dr Kelton Pillar,  Could you print these for patient? I tried 2 times and checked all the printer, I could not find printed Rx. Sorry

## 2020-05-30 NOTE — Telephone Encounter (Signed)
Patient also needs written RX for Eplerenone 50 MG as well as any other written by Dr Kelton Pillar.  All will be 0 cost if patient takes to [place]

## 2020-05-30 NOTE — Telephone Encounter (Signed)
Patient called and is requesting a paper RX for her levothyroxine (SYNTHROID) 125 MCG tablet.  If she has a paper RX she can take it to the [facility] and it will be filled for $0 co pay. electronic RX cost money. Please call patient at [phone] to advise when RX is ready for pick up

## 2020-05-30 NOTE — Telephone Encounter (Signed)
Ok to print

## 2020-08-01 ENCOUNTER — Other Ambulatory Visit: Payer: Self-pay | Admitting: Internal Medicine

## 2020-08-01 ENCOUNTER — Encounter: Payer: Self-pay | Admitting: Internal Medicine

## 2020-08-01 DIAGNOSIS — C73 Malignant neoplasm of thyroid gland: Secondary | ICD-10-CM

## 2020-08-01 NOTE — Telephone Encounter (Signed)
OK to have labs. Infection will cause heart race. If she is not on sufficient thyroid hormone she has a risk of recurrent of the thyroid cancer

## 2020-08-02 ENCOUNTER — Other Ambulatory Visit: Payer: Self-pay

## 2020-08-02 ENCOUNTER — Other Ambulatory Visit (INDEPENDENT_AMBULATORY_CARE_PROVIDER_SITE_OTHER): Payer: No Typology Code available for payment source

## 2020-08-02 DIAGNOSIS — C73 Malignant neoplasm of thyroid gland: Secondary | ICD-10-CM | POA: Diagnosis not present

## 2020-08-02 LAB — TSH: TSH: 0.32 u[IU]/mL — ABNORMAL LOW (ref 0.35–4.50)

## 2020-08-03 LAB — THYROGLOBULIN LEVEL: Thyroglobulin: 0.4 ng/mL — ABNORMAL LOW

## 2020-08-03 LAB — THYROGLOBULIN ANTIBODY: Thyroglobulin Ab: <1 IU/mL (ref <=1)

## 2020-08-04 ENCOUNTER — Other Ambulatory Visit: Payer: Self-pay

## 2020-08-04 ENCOUNTER — Ambulatory Visit (INDEPENDENT_AMBULATORY_CARE_PROVIDER_SITE_OTHER): Payer: No Typology Code available for payment source | Admitting: Allergy

## 2020-08-04 ENCOUNTER — Encounter: Payer: Self-pay | Admitting: Allergy

## 2020-08-04 VITALS — BP 130/80 | HR 88 | Temp 98.0°F | Resp 18 | Ht 61.0 in | Wt 152.2 lb

## 2020-08-04 DIAGNOSIS — H1013 Acute atopic conjunctivitis, bilateral: Secondary | ICD-10-CM | POA: Diagnosis not present

## 2020-08-04 DIAGNOSIS — J4540 Moderate persistent asthma, uncomplicated: Secondary | ICD-10-CM | POA: Diagnosis not present

## 2020-08-04 DIAGNOSIS — T7800XA Anaphylactic reaction due to unspecified food, initial encounter: Secondary | ICD-10-CM

## 2020-08-04 DIAGNOSIS — T781XXD Other adverse food reactions, not elsewhere classified, subsequent encounter: Secondary | ICD-10-CM

## 2020-08-04 DIAGNOSIS — J3089 Other allergic rhinitis: Secondary | ICD-10-CM

## 2020-08-04 MED ORDER — FLUTICASONE-SALMETEROL 250-50 MCG/DOSE IN AEPB: 1.0000 | INHALATION_SPRAY | Freq: Two times a day (BID) | RESPIRATORY_TRACT | 5 refills | Status: DC

## 2020-08-04 MED ORDER — LORATADINE 10 MG PO TABS
10.0000 mg | ORAL_TABLET | Freq: Every day | ORAL | 2 refills | Status: DC
Start: 2020-08-04 — End: 2021-02-08

## 2020-08-04 MED ORDER — EPINEPHRINE 0.3 MG/0.3ML IJ SOAJ: 0.3000 mg | Freq: Once | INTRAMUSCULAR | 2 refills | Status: AC

## 2020-08-04 NOTE — Progress Notes (Signed)
Follow-up Note  RE: Maureen Smith MRN: [id number] DOB: January 08, 1966 Date of Office Visit: 08/04/2020   History of present illness: Maureen Smith is a 55 y.o. female presenting today for follow-up of allergic rhinitis, asthma, food allergy with oral allergy syndrome. She was last seen in the office on 02/04/2020 for her initial visit.  She is still interested allergen immunotherapy.  She states she did call the office in regards to starting allergen immunotherapy however as she was never started on these yet.  She states she can tell that the tree pollen is out and present as she is having to use her Allegra, Flonase and Patanol.  She also states she is needing to use a decongestant as well for congestion issues.  She had pneumonia about 3 weeks ago and has completed antibiotic course.  She states she did have Covid testing that was negative.  She still has some residual cough with that. She is using [place] and states this works way better than the Asmanex she was on previously.  She states she did need to use her albuterol with the pneumonia but does not recall needing albuterol any other time since she has been on the [place].  She continues to avoid all tree nuts and certain fruits and vegetables as this do cause oral symptoms like throat itch with ingestion.  She does have access to an epinephrine device.  Review of systems: Review of Systems  Constitutional: Negative.   HENT: Positive for congestion.   Eyes: Negative.   Respiratory: Positive for cough and sputum production.   Cardiovascular: Negative.   Gastrointestinal: Negative.   Musculoskeletal: Negative.   Skin: Negative.   Neurological: Negative.     All other systems negative unless noted above in HPI  Past medical/social/surgical/family history have been reviewed and are unchanged unless specifically indicated below.  No changes  Medication List: Current Outpatient Medications  Medication Sig Dispense Refill  .  albuterol (PROVENTIL HFA;VENTOLIN HFA) 108 (90 BASE) MCG/ACT inhaler Inhale 2 puffs into the lungs every 6 (six) hours as needed for wheezing or shortness of breath.    Marland Kitchen amLODipine (NORVASC) 5 MG tablet Take 5 mg by mouth daily.     Marland Kitchen aspirin EC 81 MG tablet Take 81 mg by mouth every other day.     Marland Kitchen atorvastatin (LIPITOR) 40 MG tablet Take 1 tablet (40 mg total) by mouth daily. 90 tablet 3  . betamethasone dipropionate (DIPROLENE) 0.05 % ointment 1 APPLICATION APPLY ON THE SKIN TWICE A DAY APPLY TO HANDS WITH FLARES. TAPER USE AS ABLE    . betamethasone dipropionate 7.82 % lotion 1 APPLICATION APPLY TO THE SCALP DAILY TAPER USE AS ABLE    . bisacodyl (DULCOLAX) 10 MG suppository Place 10 mg rectally as needed for moderate constipation.    . conjugated estrogens (PREMARIN) vaginal cream Place 1 Applicatorful vaginally every Monday, Wednesday, and Friday.     . dicyclomine (BENTYL) 10 MG capsule Take 10 mg by mouth as needed for spasms.    Marland Kitchen docusate sodium (COLACE) 100 MG capsule Take 100 mg by mouth daily as needed for mild constipation.    Marland Kitchen eplerenone (INSPRA) 50 MG tablet Take 1 tablet (50 mg total) by mouth daily. 90 tablet 3  . fexofenadine (ALLEGRA) 180 MG tablet Take 180 mg by mouth as needed for allergies or rhinitis.    . fluocinolone (VANOS) 0.01 % cream Apply topically as needed.    . fluticasone (FLONASE) 50 MCG/ACT nasal spray Place  1 spray into both nostrils daily as needed for allergies.     . Fluticasone-Salmeterol (ADVAIR DISKUS) 250-50 MCG/DOSE AEPB Inhale 1 puff into the lungs 2 (two) times daily. 1 each 5  . ketoconazole (NIZORAL) 2 % shampoo Apply 1 application topically as directed.     Marland Kitchen ketotifen (ZADITOR) 0.025 % ophthalmic solution 1 drop 2 (two) times daily.    Marland Kitchen levothyroxine (SYNTHROID) 125 MCG tablet Take 1 tablet (125 mcg total) by mouth daily before breakfast. 90 tablet 1  . loratadine (CLARITIN) 10 MG tablet Take 10 mg by mouth daily.    . mometasone (ASMANEX)  220 MCG/INH inhaler Inhale 1 puff into the lungs at bedtime.    Marland Kitchen olopatadine (PATANOL) 0.1 % ophthalmic solution Place 1 drop into both eyes 2 (two) times daily.    Marland Kitchen omeprazole (PRILOSEC) 20 MG capsule Take 20 mg by mouth daily.    Marland Kitchen omeprazole (PRILOSEC) 40 MG capsule Take 40 mg by mouth daily.    Marland Kitchen oxybutynin (DITROPAN-XL) 10 MG 24 hr tablet Take 10 mg by mouth at bedtime.    . potassium chloride SA (KLOR-CON) 20 MEQ tablet Take 1 tablet (20 mEq total) by mouth daily. 90 tablet 1  . tacrolimus (PROTOPIC) 0.1 % ointment 1 APPLICATION APPLY ON THE SKIN TWICE A DAY APPLY TO AREAS ON FACE/EYELIDS    . triamcinolone cream (KENALOG) 0.1 % Apply 1 application topically 2 (two) times daily as needed (skin irritation).     . Vitamin D, Ergocalciferol, (DRISDOL) 50000 UNITS CAPS capsule Take 50,000 Units by mouth every Wednesday.   0   No current facility-administered medications for this visit.     Known medication allergies: Allergies  Allergen Reactions  . Augmentin [Amoxicillin-Pot Clavulanate] Nausea And Vomiting    Did it involve swelling of the face/tongue/throat, SOB, or low BP? No Did it involve sudden or severe rash/hives, skin peeling, or any reaction on the inside of your mouth or nose? No Did you need to seek medical attention at a hospital or doctor's office? Less than 10 years When did it last happen? If all above answers are "NO", may proceed with cephalosporin use.   Marland Kitchen Morphine Nausea And Vomiting     Physical examination: Blood pressure 130/80, pulse 88, temperature 98 F (36.7 C), resp. rate 18, height 5\' 1"  (1.549 m), weight 152 lb 3.2 oz (69 kg), SpO2 98 %.  General: Alert, interactive, in no acute distress. HEENT: PERRLA, TMs pearly gray, turbinates mildly edematous without discharge, post-pharynx non erythematous. Neck: Supple without lymphadenopathy. Lungs: Clear to auscultation without wheezing, rhonchi or rales. {no increased work of breathing. CV: Normal  S1, S2 without murmurs. Abdomen: Nondistended, nontender. Skin: Warm and dry, without lesions or rashes. Extremities:  No clubbing, cyanosis or edema. Neuro:   Grossly intact.  Diagnositics/Labs:  Spirometry: FEV1: 1.48L 73%, FVC: 1.88L 74% predicted.  This is stable   Assessment and plan: Allergic rhinitis with conjunctivitis  - continue avoidance measures for grasses, weeds, trees, molds, dust mite, cat  - continue your current medication regimen of Allegra, Flonase and Patanol  - continue nasal saline spray/rinse as needed  - allergen immunotherapy previously discussed including protocol, benefits and risk.  You can schedule a new start appointment today.   Bring your epipen on days of your injections.    Asthma, moderate persistent  - lung function today is stable  - continue Advair (Wixela) 1 puff twice a day  - have access to albuterol inhaler 2 puffs  or albuterol nebulizer every 4-6 hours as needed for cough/wheeze/shortness of breath/chest tightness.  May use 15-20 minutes prior to activity.   Monitor frequency of use.     Asthma control goals:   Full participation in all desired activities (may need albuterol before activity)  Albuterol use two time or less a week on average (not counting use with activity)  Cough interfering with sleep two time or less a month  Oral steroids no more than once a year  No hospitalizations  Food allergy  - continue your current food avoidance as directed by Dr. Shelah Lewandowsky  - have access to self-injectable epinephrine Epipen 0.3mg  at all times  - follow emergency action plan in case of allergic reaction  Oral allergy syndrome - The oral allergy syndrome (OAS) or pollen-food allergy syndrome (PFAS) is a relatively common form of food allergy, particularly in adults. It typically occurs in people who have pollen allergies when the immune system "sees" proteins on the food that look like proteins on the pollen. This results in the  allergy antibody (IgE) binding to the food instead of the pollen. Patients typically report itching and/or mild swelling of the mouth and throat immediately following ingestion of certain uncooked fruits (including nuts) or raw vegetables. Only a very small number of affected individuals experience systemic allergic reactions, such as anaphylaxis which occurs with true food allergies.     Follow-up in 6 months or sooner if needed  I appreciate the opportunity to take part in Leslie's care. Please do not hesitate to contact me with questions.  Sincerely,   Prudy Feeler, MD Allergy/Immunology [facility]

## 2020-08-04 NOTE — Addendum Note (Signed)
Addended by: Larence Penning on: 08/04/2020 07:11 PM   Modules accepted: Orders

## 2020-08-04 NOTE — Patient Instructions (Addendum)
Allergies  - continue avoidance measures for grasses, weeds, trees, molds, dust mite, cat  - continue your current medication regimen of Allegra, Flonase and Patanol  - continue nasal saline spray/rinse as needed  - allergen immunotherapy previously discussed including protocol, benefits and risk.  You can schedule a new start appointment today.   Bring your epipen on days of your injections.    Asthma  - lung function today is stable  - continue Advair (Wixela) HFA  230/21 take 2 puffs twice a day  - have access to albuterol inhaler 2 puffs or albuterol nebulizer every 4-6 hours as needed for cough/wheeze/shortness of breath/chest tightness.  May use 15-20 minutes prior to activity.   Monitor frequency of use.     Asthma control goals:   Full participation in all desired activities (may need albuterol before activity)  Albuterol use two time or less a week on average (not counting use with activity)  Cough interfering with sleep two time or less a month  Oral steroids no more than once a year  No hospitalizations  Food allergy  - continue your current food avoidance as directed by Dr. Shelah Lewandowsky  - have access to self-injectable epinephrine Epipen 0.3mg  at all times  - follow emergency action plan in case of allergic reaction  Oral allergy syndrome - The oral allergy syndrome (OAS) or pollen-food allergy syndrome (PFAS) is a relatively common form of food allergy, particularly in adults. It typically occurs in people who have pollen allergies when the immune system "sees" proteins on the food that look like proteins on the pollen. This results in the allergy antibody (IgE) binding to the food instead of the pollen. Patients typically report itching and/or mild swelling of the mouth and throat immediately following ingestion of certain uncooked fruits (including nuts) or raw vegetables. Only a very small number of affected individuals experience systemic allergic reactions, such as  anaphylaxis which occurs with true food allergies.        Follow-up in 6 months or sooner if needed

## 2020-08-16 NOTE — Addendum Note (Signed)
Addended by: Theresia Lo on: 08/16/2020 05:13 PM   Modules accepted: Orders

## 2020-08-17 DIAGNOSIS — J3081 Allergic rhinitis due to animal (cat) (dog) hair and dander: Secondary | ICD-10-CM | POA: Diagnosis not present

## 2020-08-17 NOTE — Progress Notes (Unsigned)
Aeroallergen Immunotherapy    Patient Details  Name: Maureen Smith  MRN: [id number]  Date of Birth: 12-Sep-1965   Order 1 of 2   Vial Label: Pollen, pet   0.3 ml (Volume) BAU Concentration -- 7 Grass Mix* 100,000 ([address] [place], [place], [place], Perennial [place], [place], Sweet [place], Timothy)  0.2 ml (Volume) 1:20 Concentration -- [place]  0.3 ml (Volume) BAU Concentration -- [place] 10,000  0.2 ml (Volume) 1:20 Concentration -- [place]  0.3 ml (Volume) 1:20 Concentration -- Ragweed Mix  0.2 ml (Volume) 1:20 Concentration -- Burweed [place]er  0.2 ml (Volume) 1:10 Concentration -- Plantain [place]  0.5 ml (Volume) 1:20 Concentration -- Weed Mix*  0.5 ml (Volume) 1:20 Concentration -- Eastern 10 Tree Mix (also Sweet Gum)  0.2 ml (Volume) 1:10 Concentration -- Pecan Pollen  0.2 ml (Volume) 1:20 Concentration -- Walnut, Black Pollen  0.5 ml (Volume) 1:10 Concentration -- Cat Hair  0.5 ml (Volume) 1:10 Concentration -- Dog Epithelia    4.1 ml Extract Subtotal  0.9 ml Diluent  5.0 ml Maintenance Total    Final Concentration above is stated in weight/volume (wt/vol). Allergen units (AU/ml) biological units (BAU/ml). The total volume is 5 ml.    Schedule: B   Special Instructions: 1 injection/week

## 2020-08-17 NOTE — Progress Notes (Unsigned)
VIALS EXP 08-17-21

## 2020-08-17 NOTE — Progress Notes (Unsigned)
Aeroallergen Immunotherapy    Patient Details  Name: Maureen Smith  MRN: [id number]  Date of Birth: Mar 13, 1966   Order 2 of 2   Vial Label: Mold, mite   0.2 ml (Volume) 1:20 Concentration -- Alternaria alternata  0.2 ml (Volume) 1:20 Concentration -- Cladosporium herbarum  0.2 ml (Volume) 1:10 Concentration -- Aspergillus mix  0.2 ml (Volume) 1:10 Concentration -- Penicillium mix  0.2 ml (Volume) 1:20 Concentration -- Bipolaris sorokiniana  0.2 ml (Volume) 1:20 Concentration -- Drechslera spicifera  0.2 ml (Volume) 1:10 Concentration -- Fusarium moniliforme  0.2 ml (Volume) 1:40 Concentration -- Botrytis cinerea  0.2 ml (Volume) 1:40 Concentration -- Epicoccum nigrum  0.2 ml (Volume) 1:40 Concentration -- Phoma betae  0.5 ml (Volume)  AU Concentration -- Mite Mix (DF 5,000 & DP 5,000)    2.5 ml Extract Subtotal  2.5 ml Diluent  5.0 ml Maintenance Total    Final Concentration above is stated in weight/volume (wt/vol). Allergen units (AU/ml) biological units (BAU/ml). The total volume is 5 ml.    Schedule: B   Special Instructions: 1 injection/week

## 2020-08-18 DIAGNOSIS — J3089 Other allergic rhinitis: Secondary | ICD-10-CM

## 2020-08-25 ENCOUNTER — Ambulatory Visit (INDEPENDENT_AMBULATORY_CARE_PROVIDER_SITE_OTHER): Payer: No Typology Code available for payment source | Admitting: *Deleted

## 2020-08-25 ENCOUNTER — Other Ambulatory Visit: Payer: Self-pay

## 2020-08-25 DIAGNOSIS — J309 Allergic rhinitis, unspecified: Secondary | ICD-10-CM | POA: Diagnosis not present

## 2020-08-25 NOTE — Progress Notes (Signed)
Immunotherapy   Patient Details  Name: Maureen Smith MRN: [id number] Date of Birth: March 28, 1966  08/25/2020  Maureen Smith started injections for  Pollen-Pet, Mold-Mite Following schedule: B  Frequency:1 time per week Epi-Pen:Epi-Pen Available  Consent signed and patient instructions given.  Patient started allergy injections today and received 0.82mL of Pollen-Pet in the RUA and 0.76mL of Mold-Mite in the LUA. Patient waited 30 minutes and did not experience any issues.   Derek Huneycutt Fernandez-Vernon 08/25/2020, 11:00 AM

## 2020-08-29 ENCOUNTER — Ambulatory Visit: Payer: Self-pay

## 2020-09-01 ENCOUNTER — Ambulatory Visit (INDEPENDENT_AMBULATORY_CARE_PROVIDER_SITE_OTHER): Payer: No Typology Code available for payment source | Admitting: *Deleted

## 2020-09-01 DIAGNOSIS — J309 Allergic rhinitis, unspecified: Secondary | ICD-10-CM

## 2020-09-07 ENCOUNTER — Telehealth: Payer: Self-pay | Admitting: *Deleted

## 2020-09-07 NOTE — Telephone Encounter (Signed)
Patient had a 5+ reaction to .9mL of her Blue vials. Do you recommend we do anything different with her vials or just decrease per scheduled recommendation?

## 2020-09-07 NOTE — Telephone Encounter (Signed)
Patient's allergy flow sheet has been updated to reflect these changes. Called patient and advised of plan. Patient verbalized understanding.

## 2020-09-08 ENCOUNTER — Encounter: Payer: Self-pay | Admitting: Internal Medicine

## 2020-09-08 ENCOUNTER — Other Ambulatory Visit: Payer: Self-pay

## 2020-09-08 ENCOUNTER — Ambulatory Visit (INDEPENDENT_AMBULATORY_CARE_PROVIDER_SITE_OTHER): Payer: No Typology Code available for payment source | Admitting: *Deleted

## 2020-09-08 ENCOUNTER — Ambulatory Visit (INDEPENDENT_AMBULATORY_CARE_PROVIDER_SITE_OTHER): Payer: No Typology Code available for payment source | Admitting: Internal Medicine

## 2020-09-08 VITALS — BP 120/82 | HR 88 | Ht 61.0 in | Wt 155.1 lb

## 2020-09-08 DIAGNOSIS — E269 Hyperaldosteronism, unspecified: Secondary | ICD-10-CM

## 2020-09-08 DIAGNOSIS — E890 Postprocedural hypothyroidism: Secondary | ICD-10-CM | POA: Diagnosis not present

## 2020-09-08 DIAGNOSIS — C73 Malignant neoplasm of thyroid gland: Secondary | ICD-10-CM

## 2020-09-08 DIAGNOSIS — J309 Allergic rhinitis, unspecified: Secondary | ICD-10-CM

## 2020-09-08 LAB — BASIC METABOLIC PANEL
BUN: 16 mg/dL (ref 6–23)
CO2: 29 mEq/L (ref 19–32)
Calcium: 9.2 mg/dL (ref 8.4–10.5)
Chloride: 102 mEq/L (ref 96–112)
Creatinine, Ser: 0.87 mg/dL (ref 0.40–1.20)
GFR: 76.5 mL/min (ref >60.00)
Glucose, Bld: 79 mg/dL (ref 70–99)
Potassium: 3.8 mEq/L (ref 3.5–5.1)
Sodium: 136 mEq/L (ref 135–145)

## 2020-09-08 LAB — TSH: TSH: 0.36 u[IU]/mL (ref 0.35–4.50)
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
## 2020-09-09 LAB — THYROGLOBULIN ANTIBODY: Thyroglobulin Ab: <1 IU/mL (ref <=1)

## 2020-09-09 LAB — THYROGLOBULIN LEVEL: Thyroglobulin: 0.4 ng/mL — ABNORMAL LOW

## 2020-09-12 ENCOUNTER — Ambulatory Visit (INDEPENDENT_AMBULATORY_CARE_PROVIDER_SITE_OTHER): Payer: No Typology Code available for payment source | Admitting: Family

## 2020-09-12 ENCOUNTER — Encounter: Payer: Self-pay | Admitting: Family

## 2020-09-12 ENCOUNTER — Other Ambulatory Visit: Payer: Self-pay

## 2020-09-12 VITALS — BP 126/82 | HR 91 | Temp 97.7°F | Resp 16 | Ht 61.0 in | Wt 156.4 lb

## 2020-09-12 DIAGNOSIS — T7800XD Anaphylactic reaction due to unspecified food, subsequent encounter: Secondary | ICD-10-CM

## 2020-09-12 DIAGNOSIS — T7800XA Anaphylactic reaction due to unspecified food, initial encounter: Secondary | ICD-10-CM

## 2020-09-12 DIAGNOSIS — T781XXD Other adverse food reactions, not elsewhere classified, subsequent encounter: Secondary | ICD-10-CM

## 2020-09-12 DIAGNOSIS — J4540 Moderate persistent asthma, uncomplicated: Secondary | ICD-10-CM | POA: Diagnosis not present

## 2020-09-12 DIAGNOSIS — H1013 Acute atopic conjunctivitis, bilateral: Secondary | ICD-10-CM

## 2020-09-12 DIAGNOSIS — J3089 Other allergic rhinitis: Secondary | ICD-10-CM

## 2020-09-12 NOTE — Patient Instructions (Addendum)
Allergic rhinitis -Stop Allegra-D and start using over the counter antihistamine such as Zyrtec, Allegra, Xyzal or Claritin  - continue avoidance measures for grasses, weeds, trees, molds, dust mite, cat  - continue your current medication regimen of  Flonase and Patanol. When using Flonase go  in the right nostril, point the applicator out toward the right ear. In the left nostril, point the applicator out toward the left ear. Try using Flonase more consistently.  - continue nasal saline spray/rinse as needed. Use this prior to any medicated nasal sprays  - continue allergen immunotherapy and carry epinephrine autoinjector with you at all times  Asthma  - lung function today is stable  - continue Advair (Wixela) HFA  230/21 take 2 puffs twice a day  - have access to albuterol inhaler 2 puffs or albuterol nebulizer every 4-6 hours as needed for cough/wheeze/shortness of breath/chest tightness.  May use 15-20 minutes prior to activity.   Monitor frequency of use.     Asthma control goals:   Full participation in all desired activities (may need albuterol before activity)  Albuterol use two time or less a week on average (not counting use with activity)  Cough interfering with sleep two time or less a month  Oral steroids no more than once a year  No hospitalizations  Food allergy  - continue your current food avoidance as directed by Dr. Shelah Lewandowsky  - have access to self-injectable epinephrine Epipen 0.3mg  at all times  - follow emergency action plan in case of allergic reaction  Oral allergy syndrome Avoid fruits that cause itching in your mouth - The oral allergy syndrome (OAS) or pollen-food allergy syndrome (PFAS) is a relatively common form of food allergy, particularly in adults. It typically occurs in people who have pollen allergies when the immune system "sees" proteins on the food that look like proteins on the pollen. This results in the allergy antibody (IgE) binding to  the food instead of the pollen. Patients typically report itching and/or mild swelling of the mouth and throat immediately following ingestion of certain uncooked fruits (including nuts) or raw vegetables. Only a very small number of affected individuals experience systemic allergic reactions, such as anaphylaxis which occurs with true food allergies.         Please let us know if this treatment plan is not working well for you. Keep already scheduled follow-up appointment on February 08, 2021 at 10:20 AM with Dr. Nelva Bush

## 2020-09-12 NOTE — Addendum Note (Signed)
Addended by: Larence Penning on: 09/12/2020 05:40 PM   Modules accepted: Orders

## 2020-09-12 NOTE — Progress Notes (Signed)
[place] [address] Dept: [phone]  FOLLOW UP NOTE  Patient ID: Maureen Smith, female    DOB: 1965/09/11  Age: 55 y.o. MRN: [id number] Date of Office Visit: 09/12/2020  Assessment  Chief Complaint: Allergic Rhinitis  (Has been better for the past 2 days. Had a fruit bowl and smoothie sunday and has been having sneezing and stuffiness. Did Flonase, allegra, and a saline rinse with little improvement )  HPI Maureen Smith is a 55 year old female who presents today for an acute visit.  She was last seen on August 04, 2020 by Dr. Nelva Bush for allergic rhinitis with conjunctivitis, moderate persistent asthma, food allergy, and oral allergy syndrome.  Allergic rhinitis is reported as not well controlled with Allegra-D, Flonase nasal spray as needed, saline rinse as needed, and Patanol eyedrops as needed.  She reports that 2 days ago she developed watery eyes, sniffing with a little bit of green rhinorrhea, sneezing, nasal congestion, sinus tenderness, and postnasal drip.  She feels that she may have a sinus infection.  She uses Allegra-D because the other antihistamines do not help.  She continues to build up with her environmental allergy injections.  She denies any large local reactions at this time.  Moderate persistent asthma is reported as moderately controlled with Advair (Wixela) 1 puff twice a day and albuterol as needed.  She reports a little bit of dry cough and denies any wheezing, tightness in her chest, and shortness of breath.  She has not required any systemic steroids since her last office visit due to breathing problems.  She used her albuterol once last week.  She continues to avoid foods as directed by Dr. Shelah Lewandowsky.  She reports that this would be a lot of food but she tries.  She mentions that she went to the food festival yesterday and had throat itching after drinking a fruit smoothie.  She reports that the fruit smoothie had mango, banana, avocado, and  pineapple in it.  She also then later on had a drink that had pineapple mint, and ginger in it and a fruit bowl with pineapple, blueberry, strawberry, and kiwi.   Drug Allergies:  Allergies  Allergen Reactions  . Augmentin [Amoxicillin-Pot Clavulanate] Nausea And Vomiting    Did it involve swelling of the face/tongue/throat, SOB, or low BP? No Did it involve sudden or severe rash/hives, skin peeling, or any reaction on the inside of your mouth or nose? No Did you need to seek medical attention at a hospital or doctor's office? Less than 10 years When did it last happen? If all above answers are "NO", may proceed with cephalosporin use.   Marland Kitchen Morphine Nausea And Vomiting    Review of Systems: Review of Systems  Constitutional: Negative for chills and fever.  HENT:       Reports rhinorrhea with a little bit of green nasal drainage, nasal congestion, postnasal drip, sneezing, and sinus tenderness.  Symptoms started 2 days ago.  Eyes:       Reports itchy watery eyes for which Patanol eyedrops help  Respiratory: Positive for cough. Negative for shortness of breath and wheezing.        Reports a little bit of dry cough and denies shortness of breath, wheezing and tightness in her chest  Cardiovascular: Negative for chest pain and palpitations.  Gastrointestinal: Negative for heartburn.  Genitourinary: Negative for dysuria.  Skin: Positive for itching.       Reports itching due to eczema  Neurological: Negative for  headaches.  Endo/Heme/Allergies: Positive for environmental allergies.    Physical Exam: BP 126/82   Pulse 91   Temp 97.7 F (36.5 C)   Resp 16   Ht 5\' 1"  (1.549 m)   Wt 156 lb 6.4 oz (70.9 kg)   SpO2 97%   BMI 29.55 kg/m    Physical Exam Constitutional:      Appearance: Normal appearance.  HENT:     Head: Normocephalic and atraumatic.     Comments: Pharynx normal, eyes normal, ears: Unable to see left tympanic membrane due to cerumen, right ear normal.   Nose: Bilateral lower turbinates moderately edematous and slightly erythematous with no drainage noted    Right Ear: Tympanic membrane, ear canal and external ear normal.     Left Ear: Ear canal and external ear normal.     Mouth/Throat:     Mouth: Mucous membranes are moist.     Pharynx: Oropharynx is clear.  Eyes:     Conjunctiva/sclera: Conjunctivae normal.  Cardiovascular:     Rate and Rhythm: Regular rhythm.     Heart sounds: Normal heart sounds.  Pulmonary:     Effort: Pulmonary effort is normal.     Breath sounds: Normal breath sounds.     Comments: Lungs clear to auscultation Musculoskeletal:     Cervical back: Neck supple.  Skin:    General: Skin is warm.  Neurological:     Mental Status: She is alert and oriented to person, place, and time.  Psychiatric:        Mood and Affect: Mood normal.        Behavior: Behavior normal.        Thought Content: Thought content normal.        Judgment: Judgment normal.     Diagnostics: None  Assessment and Plan: 1. Non-seasonal allergic rhinitis due to other allergic trigger   2. Pollen-food allergy, subsequent encounter   3. Allergic conjunctivitis of both eyes   4. Moderate persistent asthma, uncomplicated   5. Anaphylaxis due to food     No orders of the defined types were placed in this encounter.   Patient Instructions  Allergic rhinitis -Stop Allegra-D and start using over the counter antihistamine such as Zyrtec, Allegra, Xyzal or Claritin  - continue avoidance measures for grasses, weeds, trees, molds, dust mite, cat  - continue your current medication regimen of  Flonase and Patanol. When using Flonase go  in the right nostril, point the applicator out toward the right ear. In the left nostril, point the applicator out toward the left ear. Try using Flonase more consistently.  - continue nasal saline spray/rinse as needed. Use this prior to any medicated nasal sprays  - continue allergen immunotherapy and carry  epinephrine autoinjector with you at all times  Asthma  - lung function today is stable  - continue Advair (Wixela) HFA  230/21 take 2 puffs twice a day  - have access to albuterol inhaler 2 puffs or albuterol nebulizer every 4-6 hours as needed for cough/wheeze/shortness of breath/chest tightness.  May use 15-20 minutes prior to activity.   Monitor frequency of use.     Asthma control goals:   Full participation in all desired activities (may need albuterol before activity)  Albuterol use two time or less a week on average (not counting use with activity)  Cough interfering with sleep two time or less a month  Oral steroids no more than once a year  No hospitalizations  Food allergy  -  continue your current food avoidance as directed by Dr. Shelah Lewandowsky  - have access to self-injectable epinephrine Epipen 0.3mg  at all times  - follow emergency action plan in case of allergic reaction  Oral allergy syndrome Avoid fruits that cause itching in your mouth - The oral allergy syndrome (OAS) or pollen-food allergy syndrome (PFAS) is a relatively common form of food allergy, particularly in adults. It typically occurs in people who have pollen allergies when the immune system "sees" proteins on the food that look like proteins on the pollen. This results in the allergy antibody (IgE) binding to the food instead of the pollen. Patients typically report itching and/or mild swelling of the mouth and throat immediately following ingestion of certain uncooked fruits (including nuts) or raw vegetables. Only a very small number of affected individuals experience systemic allergic reactions, such as anaphylaxis which occurs with true food allergies.         Please let us know if this treatment plan is not working well for you. Keep already scheduled follow-up appointment on February 08, 2021 at 10:20 AM with Dr. Nelva Bush   Return in about 5 months (around 02/08/2021), or if symptoms worsen or  fail to improve.    Thank you for the opportunity to care for this patient.  Please do not hesitate to contact me with questions.  Althea Charon, FNP [facility]

## 2020-09-12 NOTE — Addendum Note (Signed)
Addended by: Larence Penning on: 09/12/2020 05:42 PM   Modules accepted: Orders

## 2020-09-15 ENCOUNTER — Ambulatory Visit (INDEPENDENT_AMBULATORY_CARE_PROVIDER_SITE_OTHER): Payer: No Typology Code available for payment source | Admitting: *Deleted

## 2020-09-15 DIAGNOSIS — J309 Allergic rhinitis, unspecified: Secondary | ICD-10-CM

## 2020-09-20 ENCOUNTER — Ambulatory Visit
Admission: RE | Admit: 2020-09-20 | Discharge: 2020-09-20 | Disposition: A | Discharge status: Home / Self Care | Payer: No Typology Code available for payment source | Source: Ambulatory Visit | Attending: Internal Medicine | Admitting: Internal Medicine

## 2020-09-20 DIAGNOSIS — C73 Malignant neoplasm of thyroid gland: Secondary | ICD-10-CM

## 2020-09-23 ENCOUNTER — Other Ambulatory Visit: Payer: Self-pay

## 2020-09-23 ENCOUNTER — Other Ambulatory Visit (INDEPENDENT_AMBULATORY_CARE_PROVIDER_SITE_OTHER): Payer: No Typology Code available for payment source

## 2020-09-23 ENCOUNTER — Encounter: Payer: Self-pay | Admitting: Internal Medicine

## 2020-09-23 DIAGNOSIS — E269 Hyperaldosteronism, unspecified: Secondary | ICD-10-CM

## 2020-09-23 LAB — BASIC METABOLIC PANEL
BUN: 16 mg/dL (ref 6–23)
CO2: 27 mEq/L (ref 19–32)
Calcium: 9.3 mg/dL (ref 8.4–10.5)
Chloride: 103 mEq/L (ref 96–112)
Creatinine, Ser: 0.82 mg/dL (ref 0.40–1.20)
GFR: 82.11 mL/min (ref >60.00)
Glucose, Bld: 84 mg/dL (ref 70–99)
Potassium: 4.2 mEq/L (ref 3.5–5.1)
Sodium: 138 mEq/L (ref 135–145)

## 2020-09-27 ENCOUNTER — Ambulatory Visit (INDEPENDENT_AMBULATORY_CARE_PROVIDER_SITE_OTHER): Payer: No Typology Code available for payment source | Admitting: *Deleted

## 2020-09-27 DIAGNOSIS — J309 Allergic rhinitis, unspecified: Secondary | ICD-10-CM | POA: Diagnosis not present

## 2020-10-07 ENCOUNTER — Ambulatory Visit (INDEPENDENT_AMBULATORY_CARE_PROVIDER_SITE_OTHER): Payer: No Typology Code available for payment source | Admitting: *Deleted

## 2020-10-07 DIAGNOSIS — J309 Allergic rhinitis, unspecified: Secondary | ICD-10-CM | POA: Diagnosis not present

## 2020-10-26 ENCOUNTER — Telehealth: Payer: Self-pay

## 2020-10-26 NOTE — Telephone Encounter (Signed)
Yes lets just restart her at beginning of silver vial

## 2020-10-26 NOTE — Telephone Encounter (Signed)
Patient called wanting to come in for her injections. She is on Blue 1:100,000 last injection on 10-07-20 @ 0.05. In the immunotherapy flowsheet you stated that  if she had problems they could be dilute to silver 1:1 million. Patient stated, when she received her last injection that her left arm had a hive the size of a baseball, swollen and itchy. Please advise wheather to restart on blue or silver vial.

## 2020-10-27 NOTE — Telephone Encounter (Signed)
Noted in pts flowsheet informed [facility] of the mix down

## 2020-10-28 ENCOUNTER — Ambulatory Visit (INDEPENDENT_AMBULATORY_CARE_PROVIDER_SITE_OTHER): Payer: No Typology Code available for payment source | Admitting: *Deleted

## 2020-10-28 DIAGNOSIS — J309 Allergic rhinitis, unspecified: Secondary | ICD-10-CM | POA: Diagnosis not present

## 2020-11-07 ENCOUNTER — Ambulatory Visit (INDEPENDENT_AMBULATORY_CARE_PROVIDER_SITE_OTHER): Payer: No Typology Code available for payment source

## 2020-11-07 DIAGNOSIS — J309 Allergic rhinitis, unspecified: Secondary | ICD-10-CM

## 2020-11-16 ENCOUNTER — Ambulatory Visit (INDEPENDENT_AMBULATORY_CARE_PROVIDER_SITE_OTHER): Payer: No Typology Code available for payment source

## 2020-11-16 DIAGNOSIS — J309 Allergic rhinitis, unspecified: Secondary | ICD-10-CM | POA: Diagnosis not present

## 2020-11-24 ENCOUNTER — Ambulatory Visit (INDEPENDENT_AMBULATORY_CARE_PROVIDER_SITE_OTHER): Payer: No Typology Code available for payment source | Admitting: *Deleted

## 2020-11-24 ENCOUNTER — Encounter: Payer: Self-pay | Admitting: Family Medicine

## 2020-11-24 DIAGNOSIS — J309 Allergic rhinitis, unspecified: Secondary | ICD-10-CM | POA: Diagnosis not present

## 2020-11-29 ENCOUNTER — Telehealth: Payer: Self-pay | Admitting: *Deleted

## 2020-11-29 NOTE — Telephone Encounter (Signed)
Patient is still having some issues with large local reactions with her silver vials. Is it ok to [facility] her vials. Explained the process to the patient and she was not opposed to the idea.

## 2020-12-01 NOTE — Telephone Encounter (Signed)
Patient's allergy flow sheet has been updated to reflect these changes.  

## 2020-12-05 ENCOUNTER — Encounter: Payer: Self-pay | Admitting: Allergy

## 2020-12-05 ENCOUNTER — Ambulatory Visit (INDEPENDENT_AMBULATORY_CARE_PROVIDER_SITE_OTHER): Payer: No Typology Code available for payment source | Admitting: *Deleted

## 2020-12-05 DIAGNOSIS — J309 Allergic rhinitis, unspecified: Secondary | ICD-10-CM

## 2020-12-08 ENCOUNTER — Telehealth: Payer: Self-pay | Admitting: Internal Medicine

## 2020-12-08 NOTE — Telephone Encounter (Signed)
Pt called to report the following symptoms:  Her throat is tight and feels as if there is a knuckle pressed on it, it hurts for her to swallow and there is just all around discomfort in her throat. Pt is concerned and wants to know what the Dr says about this. Pt requests a call at [phone]

## 2020-12-08 NOTE — Telephone Encounter (Signed)
Please advise 

## 2020-12-09 NOTE — Telephone Encounter (Signed)
Routed to the front office to make F/u appt

## 2020-12-12 ENCOUNTER — Other Ambulatory Visit: Payer: Self-pay

## 2020-12-12 ENCOUNTER — Ambulatory Visit (INDEPENDENT_AMBULATORY_CARE_PROVIDER_SITE_OTHER): Payer: No Typology Code available for payment source | Admitting: Internal Medicine

## 2020-12-12 VITALS — BP 118/78 | HR 101 | Ht 61.0 in | Wt 153.2 lb

## 2020-12-12 DIAGNOSIS — R1310 Dysphagia, unspecified: Secondary | ICD-10-CM

## 2020-12-12 MED ORDER — FLUCONAZOLE 100 MG PO TABS: 100.0000 mg | ORAL_TABLET | ORAL | 0 refills | Status: DC
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
## 2020-12-12 NOTE — Patient Instructions (Signed)
-   Start Diflucan 100 mg, Two tablets on first day and 1 tablet daily after that until finished or until GI tells you to stop it

## 2020-12-13 ENCOUNTER — Telehealth: Payer: Self-pay | Admitting: *Deleted

## 2020-12-13 ENCOUNTER — Encounter: Payer: Self-pay | Admitting: Internal Medicine

## 2020-12-13 ENCOUNTER — Ambulatory Visit (INDEPENDENT_AMBULATORY_CARE_PROVIDER_SITE_OTHER): Payer: No Typology Code available for payment source | Admitting: *Deleted

## 2020-12-13 DIAGNOSIS — J309 Allergic rhinitis, unspecified: Secondary | ICD-10-CM | POA: Diagnosis not present

## 2020-12-13 NOTE — Telephone Encounter (Signed)
Letter for patient

## 2020-12-20 ENCOUNTER — Encounter: Payer: Self-pay | Admitting: Allergy and Immunology

## 2020-12-20 ENCOUNTER — Ambulatory Visit (INDEPENDENT_AMBULATORY_CARE_PROVIDER_SITE_OTHER): Payer: No Typology Code available for payment source

## 2020-12-20 DIAGNOSIS — J309 Allergic rhinitis, unspecified: Secondary | ICD-10-CM

## 2021-01-02 ENCOUNTER — Encounter: Payer: Self-pay | Admitting: Allergy

## 2021-01-02 ENCOUNTER — Ambulatory Visit (INDEPENDENT_AMBULATORY_CARE_PROVIDER_SITE_OTHER): Payer: No Typology Code available for payment source

## 2021-01-02 DIAGNOSIS — J309 Allergic rhinitis, unspecified: Secondary | ICD-10-CM | POA: Diagnosis not present

## 2021-01-12 ENCOUNTER — Encounter: Payer: Self-pay | Admitting: Allergy & Immunology

## 2021-01-12 ENCOUNTER — Ambulatory Visit (INDEPENDENT_AMBULATORY_CARE_PROVIDER_SITE_OTHER): Payer: No Typology Code available for payment source | Admitting: *Deleted

## 2021-01-12 DIAGNOSIS — J309 Allergic rhinitis, unspecified: Secondary | ICD-10-CM

## 2021-01-20 ENCOUNTER — Encounter: Payer: Self-pay | Admitting: Allergy

## 2021-01-20 ENCOUNTER — Ambulatory Visit (INDEPENDENT_AMBULATORY_CARE_PROVIDER_SITE_OTHER): Payer: No Typology Code available for payment source

## 2021-01-20 DIAGNOSIS — J309 Allergic rhinitis, unspecified: Secondary | ICD-10-CM

## 2021-01-27 ENCOUNTER — Ambulatory Visit (INDEPENDENT_AMBULATORY_CARE_PROVIDER_SITE_OTHER): Payer: No Typology Code available for payment source

## 2021-01-27 ENCOUNTER — Telehealth: Payer: Self-pay | Admitting: Allergy

## 2021-01-27 ENCOUNTER — Encounter: Payer: Self-pay | Admitting: Family Medicine

## 2021-01-27 DIAGNOSIS — J309 Allergic rhinitis, unspecified: Secondary | ICD-10-CM | POA: Diagnosis not present

## 2021-01-27 NOTE — Telephone Encounter (Signed)
Faxed renewal of authorization request to [facility] on 01-20-2021.   Emailed request to [email or web address] on 01-24-2021  Spoke to [place] at the [facility] [phone] ext [phone]) on 01-27-21. Patient's authorization is pending approval and should be sent to our office once approved.

## 2021-01-31 ENCOUNTER — Ambulatory Visit (INDEPENDENT_AMBULATORY_CARE_PROVIDER_SITE_OTHER): Payer: No Typology Code available for payment source | Admitting: *Deleted

## 2021-01-31 DIAGNOSIS — J309 Allergic rhinitis, unspecified: Secondary | ICD-10-CM

## 2021-01-31 NOTE — Telephone Encounter (Signed)
Patient came by after receiving her injection today to ask if we had heard anything from last week. I did let her know I would reach to the [facility] to check on her authorization.  [facility], spoke to [place] T who let me know authorization is still pending approval.

## 2021-02-07 NOTE — Telephone Encounter (Signed)
Authorization has been approved, updated referral for the dates of 02-02-2021 to 02-02-2022

## 2021-02-08 ENCOUNTER — Other Ambulatory Visit: Payer: Self-pay

## 2021-02-08 ENCOUNTER — Ambulatory Visit (INDEPENDENT_AMBULATORY_CARE_PROVIDER_SITE_OTHER): Payer: No Typology Code available for payment source | Admitting: Allergy

## 2021-02-08 ENCOUNTER — Encounter: Payer: Self-pay | Admitting: Allergy

## 2021-02-08 VITALS — BP 120/82 | HR 84 | Temp 98.3°F | Resp 18 | Ht 61.0 in | Wt 158.2 lb

## 2021-02-08 DIAGNOSIS — J4540 Moderate persistent asthma, uncomplicated: Secondary | ICD-10-CM | POA: Diagnosis not present

## 2021-02-08 DIAGNOSIS — J3089 Other allergic rhinitis: Secondary | ICD-10-CM

## 2021-02-08 DIAGNOSIS — T781XXD Other adverse food reactions, not elsewhere classified, subsequent encounter: Secondary | ICD-10-CM

## 2021-02-08 DIAGNOSIS — H1013 Acute atopic conjunctivitis, bilateral: Secondary | ICD-10-CM | POA: Diagnosis not present

## 2021-02-08 DIAGNOSIS — T7800XA Anaphylactic reaction due to unspecified food, initial encounter: Secondary | ICD-10-CM

## 2021-02-08 MED ORDER — OLOPATADINE HCL 0.1 % OP SOLN: 1.0000 [drp] | Freq: Two times a day (BID) | OPHTHALMIC | 5 refills | Status: DC

## 2021-02-08 MED ORDER — FLUTICASONE PROPIONATE 50 MCG/ACT NA SUSP: 1.0000 | Freq: Every day | NASAL | 5 refills | Status: DC | PRN

## 2021-02-08 MED ORDER — FEXOFENADINE HCL 180 MG PO TABS: 180.0000 mg | ORAL_TABLET | Freq: Two times a day (BID) | ORAL | 5 refills | Status: DC | PRN

## 2021-02-08 MED ORDER — LORATADINE 10 MG PO TABS: 10.0000 mg | ORAL_TABLET | Freq: Every day | ORAL | 2 refills | Status: DC

## 2021-02-08 MED ORDER — LORATADINE 10 MG PO TABS: 10.0000 mg | ORAL_TABLET | Freq: Two times a day (BID) | ORAL | 5 refills | Status: DC | PRN

## 2021-02-08 MED ORDER — ALBUTEROL SULFATE HFA 108 (90 BASE) MCG/ACT IN AERS: 2.0000 | INHALATION_SPRAY | Freq: Four times a day (QID) | RESPIRATORY_TRACT | 2 refills | Status: DC | PRN

## 2021-02-08 MED ORDER — KETOCONAZOLE 2 % EX SHAM: 1.0000 "application " | MEDICATED_SHAMPOO | CUTANEOUS | 0 refills | Status: AC

## 2021-02-08 MED ORDER — FLUTICASONE-SALMETEROL 250-50 MCG/ACT IN AEPB: 1.0000 | INHALATION_SPRAY | Freq: Two times a day (BID) | RESPIRATORY_TRACT | 5 refills | Status: DC

## 2021-02-08 NOTE — Progress Notes (Signed)
Follow-up Note  RE: Maureen Smith MRN: [id number] DOB: 06-25-1965 Date of Office Visit: 02/08/2021   History of present illness: Maureen Smith is a 55 y.o. female presenting today for symptoms after allergen immunotherapy.  She has history of allergic rhinitis, asthma, food allergy and oral allergy syndrome.  She was last seen in the office on 09/12/20 by myself.   She states with her last allergy injection on 01/31/21 she felt fatigued and "weighed down" afterwards.  She received silver vial 0.5cc injection with use of epiwash to help decrease large local reaction.  She also states she feels like her blood sugar was dropping; this is how she decreased the fatigued feeling.  She also reports feeling jittery.  She states she felt bad the rest of the night afterwards and was better by next morning.  She takes Allegra daily in the evenings.   She states her asthma has been doing well.  She denies albuterol use. She continues on wixela 2 puffs twice a day.  Denies ED/UC visits or any systemic steroid needs since last visit.   She continue current food avoidance including fresh fruits due to oral allergy syndrome. She is going out of the country in November and is getting a yellow fever vaccine this week.  She plans to come to office next Monday for immunotherapy injection.  Review of systems: Review of Systems  Constitutional:  Positive for malaise/fatigue.  HENT: Negative.    Eyes: Negative.   Respiratory: Negative.    Cardiovascular: Negative.   Gastrointestinal: Negative.   Musculoskeletal: Negative.   Skin: Negative.   Neurological:        See HPI   All other systems negative unless noted above in HPI  Past medical/social/surgical/family history have been reviewed and are unchanged unless specifically indicated below.  No changes  Medication List: Current Outpatient Medications  Medication Sig Dispense Refill   amLODipine (NORVASC) 5 MG tablet Take 5 mg by mouth daily.       aspirin EC 81 MG tablet Take 81 mg by mouth every other day.      atorvastatin (LIPITOR) 40 MG tablet Take 1 tablet (40 mg total) by mouth daily. 90 tablet 3   betamethasone dipropionate (DIPROLENE) 0.05 % ointment 1 APPLICATION APPLY ON THE SKIN TWICE A DAY APPLY TO HANDS WITH FLARES. TAPER USE AS ABLE     betamethasone dipropionate 8.24 % lotion 1 APPLICATION APPLY TO THE SCALP DAILY TAPER USE AS ABLE     bisacodyl (DULCOLAX) 10 MG suppository Place 10 mg rectally as needed for moderate constipation.     conjugated estrogens (PREMARIN) vaginal cream Place 1 Applicatorful vaginally every Monday, Wednesday, and Friday.      dicyclomine (BENTYL) 10 MG capsule Take 10 mg by mouth as needed for spasms.     docusate sodium (COLACE) 100 MG capsule Take 100 mg by mouth daily as needed for mild constipation.     eplerenone (INSPRA) 50 MG tablet Take 1 tablet (50 mg total) by mouth daily. 90 tablet 3   fexofenadine (ALLEGRA) 180 MG tablet Take 180 mg by mouth as needed for allergies or rhinitis.     fluconazole (DIFLUCAN) 100 MG tablet Take 1 tablet (100 mg total) by mouth as directed. Two tablets on day one, then 1 tablet daily after that 8 tablet 0   fluocinolone (VANOS) 0.01 % cream Apply topically as needed.     fluticasone (FLONASE) 50 MCG/ACT nasal spray Place 1 spray into both nostrils daily  as needed for allergies.      Fluticasone-Salmeterol (ADVAIR DISKUS) 250-50 MCG/DOSE AEPB Inhale 1 puff into the lungs 2 (two) times daily. 1 each 5   fluticasone-salmeterol (WIXELA INHUB) 250-50 MCG/ACT AEPB Inhale 1 puff into the lungs in the morning and at bedtime. 60 each 5   ketotifen (ZADITOR) 0.025 % ophthalmic solution 1 drop 2 (two) times daily.     levothyroxine (SYNTHROID) 125 MCG tablet Take 1 tablet (125 mcg total) by mouth daily before breakfast. 90 tablet 1   olopatadine (PATANOL) 0.1 % ophthalmic solution Place 1 drop into both eyes 2 (two) times daily.     omeprazole (PRILOSEC) 20 MG capsule  Take 20 mg by mouth daily.     omeprazole (PRILOSEC) 40 MG capsule Take 40 mg by mouth daily.     oxybutynin (DITROPAN-XL) 10 MG 24 hr tablet Take 10 mg by mouth at bedtime.     potassium chloride SA (KLOR-CON) 20 MEQ tablet Take 1 tablet (20 mEq total) by mouth daily. 90 tablet 1   tacrolimus (PROTOPIC) 0.1 % ointment 1 APPLICATION APPLY ON THE SKIN TWICE A DAY APPLY TO AREAS ON FACE/EYELIDS     triamcinolone cream (KENALOG) 0.1 % Apply 1 application topically 2 (two) times daily as needed (skin irritation).      Vitamin D, Ergocalciferol, (DRISDOL) 50000 UNITS CAPS capsule Take 50,000 Units by mouth every Wednesday.   0   albuterol (VENTOLIN HFA) 108 (90 Base) MCG/ACT inhaler Inhale 2 puffs into the lungs every 6 (six) hours as needed for wheezing or shortness of breath. 18 g 2   ketoconazole (NIZORAL) 2 % shampoo Apply 1 application topically as directed. 120 mL 0   loratadine (CLARITIN) 10 MG tablet Take 1 tablet (10 mg total) by mouth daily. 30 tablet 2   No current facility-administered medications for this visit.     Known medication allergies: Allergies  Allergen Reactions   Augmentin [Amoxicillin-Pot Clavulanate] Nausea And Vomiting    Did it involve swelling of the face/tongue/throat, SOB, or low BP? No Did it involve sudden or severe rash/hives, skin peeling, or any reaction on the inside of your mouth or nose? No Did you need to seek medical attention at a hospital or doctor's office? Less than 10 years When did it last happen?       If all above answers are "NO", may proceed with cephalosporin use.    Morphine Nausea And Vomiting     Physical examination: Blood pressure 120/82, pulse 84, temperature 98.3 F (36.8 C), temperature source Temporal, resp. rate 18, height 5\' 1"  (1.549 m), weight 158 lb 4 oz (71.8 kg), SpO2 98 %.  General: Alert, interactive, in no acute distress. HEENT: PERRLA, TMs pearly gray, turbinates non-edematous without discharge, post-pharynx non  erythematous. Neck: Supple without lymphadenopathy. Lungs: Clear to auscultation without wheezing, rhonchi or rales. {no increased work of breathing. CV: Normal S1, S2 without murmurs. Abdomen: Nondistended, nontender. Skin: Warm and dry, without lesions or rashes. Extremities:  No clubbing, cyanosis or edema. Neuro:   Grossly intact.  Diagnositics/Labs: None today  Assessment and plan:   Allergic rhinitis - take Allegra or Claritin daily.   On your allergy shot days recommend you take Allegra twice a day the day before, day of your injection and following day.  Return to daily use the rest of the week.  I am hopeful that with extra antihistamine on board surrounding your allergy shot this will minimize symptoms and you can continue and progress  to maintenance dosing.   Let me know how you feel next week after your injection with this antihistamine regimen  - continue avoidance measures for grasses, weeds, trees, molds, dust mite, cat  - continue your current medication regimen of  Flonase and Patanol. When using Flonase go  in the right nostril, point the applicator out toward the right ear. In the left nostril, point the applicator out toward the left ear. Try using Flonase more consistently.  - continue nasal saline spray/rinse as needed. Use this prior to any medicated nasal sprays  - continue allergen immunotherapy and carry epinephrine autoinjector with you at all times  Asthma  - lung function today is stable  - continue Advair (Wixela) HFA  230/21 take 2 puffs twice a day  - have access to albuterol inhaler 2 puffs or albuterol nebulizer every 4-6 hours as needed for cough/wheeze/shortness of breath/chest tightness.  May use 15-20 minutes prior to activity.   Monitor frequency of use.     Asthma control goals:  Full participation in all desired activities (may need albuterol before activity) Albuterol use two time or less a week on average (not counting use with activity) Cough  interfering with sleep two time or less a month Oral steroids no more than once a year No hospitalizations  Food allergy  - continue your current food avoidance as directed by Dr. Shelah Lewandowsky  - have access to self-injectable epinephrine Epipen 0.3mg  at all times  - follow emergency action plan in case of allergic reaction  Oral allergy syndrome Avoid fruits that cause itching in your mouth - The oral allergy syndrome (OAS) or pollen-food allergy syndrome (PFAS) is a relatively common form of food allergy, particularly in adults. It typically occurs in people who have pollen allergies when the immune system "sees" proteins on the food that look like proteins on the pollen. This results in the allergy antibody (IgE) binding to the food instead of the pollen. Patients typically report itching and/or mild swelling of the mouth and throat immediately following ingestion of certain uncooked fruits (including nuts) or raw vegetables. Only a very small number of affected individuals experience systemic allergic reactions, such as anaphylaxis which occurs with true food allergies.    Please let us know if this treatment plan is not working well for you. Follow-up in 3-4 months or sooner if needed  I appreciate the opportunity to take part in Lamiya's care. Please do not hesitate to contact me with questions.  Sincerely,   Prudy Feeler, MD Allergy/Immunology [facility]

## 2021-02-08 NOTE — Addendum Note (Signed)
Addended by: Clovis Cao A on: 02/08/2021 04:18 PM   Modules accepted: Orders

## 2021-02-08 NOTE — Patient Instructions (Addendum)
Allergic rhinitis - take Allegra or Claritin daily.   On your allergy shot days recommend you take Allegra twice a day the day before, day of your injection and following day.  Return to daily use the rest of the week.  I am hopeful that with extra antihistamine on board surrounding your allergy shot this will minimize symptoms and you can continue and progress to maintenance dosing.   Let me know how you feel next week after your injection with this antihistamine regimen  - continue avoidance measures for grasses, weeds, trees, molds, dust mite, cat  - continue your current medication regimen of  Flonase and Patanol. When using Flonase go  in the right nostril, point the applicator out toward the right ear. In the left nostril, point the applicator out toward the left ear. Try using Flonase more consistently.  - continue nasal saline spray/rinse as needed. Use this prior to any medicated nasal sprays  - continue allergen immunotherapy and carry epinephrine autoinjector with you at all times  Asthma  - lung function today is stable  - continue Advair (Wixela) HFA  230/21 take 2 puffs twice a day  - have access to albuterol inhaler 2 puffs or albuterol nebulizer every 4-6 hours as needed for cough/wheeze/shortness of breath/chest tightness.  May use 15-20 minutes prior to activity.   Monitor frequency of use.     Asthma control goals:  Full participation in all desired activities (may need albuterol before activity) Albuterol use two time or less a week on average (not counting use with activity) Cough interfering with sleep two time or less a month Oral steroids no more than once a year No hospitalizations  Food allergy  - continue your current food avoidance as directed by Dr. Shelah Lewandowsky  - have access to self-injectable epinephrine Epipen 0.3mg  at all times  - follow emergency action plan in case of allergic reaction  Oral allergy syndrome Avoid fruits that cause itching in your mouth -  The oral allergy syndrome (OAS) or pollen-food allergy syndrome (PFAS) is a relatively common form of food allergy, particularly in adults. It typically occurs in people who have pollen allergies when the immune system "sees" proteins on the food that look like proteins on the pollen. This results in the allergy antibody (IgE) binding to the food instead of the pollen. Patients typically report itching and/or mild swelling of the mouth and throat immediately following ingestion of certain uncooked fruits (including nuts) or raw vegetables. Only a very small number of affected individuals experience systemic allergic reactions, such as anaphylaxis which occurs with true food allergies.         Please let us know if this treatment plan is not working well for you. Follow-up in 3-4 months or sooner if needed

## 2021-02-09 ENCOUNTER — Encounter: Payer: Self-pay | Admitting: Allergy

## 2021-02-13 ENCOUNTER — Ambulatory Visit (INDEPENDENT_AMBULATORY_CARE_PROVIDER_SITE_OTHER): Payer: No Typology Code available for payment source

## 2021-02-13 ENCOUNTER — Encounter: Payer: Self-pay | Admitting: Allergy

## 2021-02-13 DIAGNOSIS — J309 Allergic rhinitis, unspecified: Secondary | ICD-10-CM | POA: Diagnosis not present

## 2021-02-15 ENCOUNTER — Other Ambulatory Visit: Payer: Self-pay

## 2021-02-15 ENCOUNTER — Telehealth: Payer: Self-pay

## 2021-02-15 MED ORDER — LORATADINE 10 MG PO TABS: 10.0000 mg | ORAL_TABLET | Freq: Every day | ORAL | 5 refills | Status: AC

## 2021-02-15 NOTE — Addendum Note (Signed)
Addended by: Felipa Emory on: 02/15/2021 01:52 PM   Modules accepted: Orders

## 2021-02-15 NOTE — Telephone Encounter (Signed)
[facility]

## 2021-02-15 NOTE — Telephone Encounter (Signed)
[facility] [place] [facility] generic fexofenadine but will cover loratadine or cetirizine please advise to change

## 2021-02-20 ENCOUNTER — Telehealth: Payer: Self-pay | Admitting: Internal Medicine

## 2021-02-20 NOTE — Telephone Encounter (Signed)
Pt has swollen lymphnodes at the bottom of her neck/shoulder area. Needs to know what to do next. Pt contact [phone]

## 2021-02-21 NOTE — Telephone Encounter (Signed)
Patient has been notified and verbalized understanding 

## 2021-02-21 NOTE — Telephone Encounter (Signed)
Vm left for patient and also sent a mychart message to get more information.

## 2021-02-21 NOTE — Telephone Encounter (Signed)
Mychart message sent with information and also left vm

## 2021-03-03 ENCOUNTER — Ambulatory Visit (INDEPENDENT_AMBULATORY_CARE_PROVIDER_SITE_OTHER): Payer: No Typology Code available for payment source | Admitting: *Deleted

## 2021-03-03 ENCOUNTER — Encounter: Payer: Self-pay | Admitting: Allergy

## 2021-03-03 DIAGNOSIS — J309 Allergic rhinitis, unspecified: Secondary | ICD-10-CM

## 2021-03-08 ENCOUNTER — Encounter: Payer: Self-pay | Admitting: Allergy

## 2021-03-08 ENCOUNTER — Ambulatory Visit (INDEPENDENT_AMBULATORY_CARE_PROVIDER_SITE_OTHER): Payer: No Typology Code available for payment source | Admitting: *Deleted

## 2021-03-08 DIAGNOSIS — J309 Allergic rhinitis, unspecified: Secondary | ICD-10-CM | POA: Diagnosis not present

## 2021-03-15 ENCOUNTER — Ambulatory Visit: Payer: Commercial Managed Care - PPO | Admitting: Internal Medicine

## 2021-04-17 ENCOUNTER — Encounter: Payer: Self-pay | Admitting: Internal Medicine

## 2021-04-17 ENCOUNTER — Other Ambulatory Visit: Payer: Self-pay

## 2021-04-17 ENCOUNTER — Ambulatory Visit (INDEPENDENT_AMBULATORY_CARE_PROVIDER_SITE_OTHER): Payer: No Typology Code available for payment source | Admitting: Internal Medicine

## 2021-04-17 VITALS — BP 124/82 | HR 100 | Ht 61.0 in | Wt 160.8 lb

## 2021-04-17 DIAGNOSIS — C73 Malignant neoplasm of thyroid gland: Secondary | ICD-10-CM

## 2021-04-17 DIAGNOSIS — E890 Postprocedural hypothyroidism: Secondary | ICD-10-CM | POA: Diagnosis not present

## 2021-04-17 DIAGNOSIS — E269 Hyperaldosteronism, unspecified: Secondary | ICD-10-CM

## 2021-04-17 DIAGNOSIS — E876 Hypokalemia: Secondary | ICD-10-CM

## 2021-04-17 MED ORDER — DEXAMETHASONE 1 MG PO TABS: 1.0000 mg | ORAL_TABLET | Freq: Once | ORAL | 0 refills | Status: AC
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
## 2021-04-17 NOTE — Patient Instructions (Signed)
   Instructions for Dexamethasone Suppression Test   Step 1: Choose a morning when you can come to our lab at 8:00 am for a blood draw.   Step 2: On the night before the blood draw, take one 1 mg tablet of dexamethasone at 11:30 pm.  The timing is VERY important!   Step 3: The next morning, go to the lab for blood work at 8:00 am.  You do not have to be on an empty stomach, but the timing is VERY important!  

## 2021-04-18 ENCOUNTER — Telehealth: Payer: Self-pay

## 2021-04-18 NOTE — Telephone Encounter (Signed)
Called patient and informed. Patient verbalized understanding and will try to come in tomorrow for her injection.

## 2021-04-18 NOTE — Telephone Encounter (Signed)
Patient has been mixed down to a new Silver vial to start.

## 2021-04-18 NOTE — Telephone Encounter (Signed)
Late Entry...Marland KitchenMarland KitchenMarland Kitchen Patient called yesterday 04/17/2021 to inform us that she is back in town and would like to come in to continue her allergy injections. Patient's last injection was 03/08/2021 in which she received 0.05 of her blue vial. Patient was on silver and completed her 0.5 on 03/03/2021. Please advise where to start patient at as tomorrow will mark 6 weeks since she last received an injection.

## 2021-04-20 ENCOUNTER — Ambulatory Visit
Admission: RE | Admit: 2021-04-20 | Discharge: 2021-04-20 | Disposition: A | Discharge status: Home / Self Care | Payer: No Typology Code available for payment source | Source: Ambulatory Visit | Attending: Internal Medicine | Admitting: Internal Medicine

## 2021-04-20 ENCOUNTER — Ambulatory Visit (INDEPENDENT_AMBULATORY_CARE_PROVIDER_SITE_OTHER): Payer: No Typology Code available for payment source | Admitting: *Deleted

## 2021-04-20 ENCOUNTER — Encounter: Payer: Self-pay | Admitting: Allergy & Immunology

## 2021-04-20 DIAGNOSIS — J309 Allergic rhinitis, unspecified: Secondary | ICD-10-CM | POA: Diagnosis not present

## 2021-04-20 DIAGNOSIS — C73 Malignant neoplasm of thyroid gland: Secondary | ICD-10-CM

## 2021-04-25 ENCOUNTER — Encounter: Payer: Self-pay | Admitting: Allergy and Immunology

## 2021-04-25 ENCOUNTER — Ambulatory Visit (INDEPENDENT_AMBULATORY_CARE_PROVIDER_SITE_OTHER): Payer: No Typology Code available for payment source

## 2021-04-25 DIAGNOSIS — J309 Allergic rhinitis, unspecified: Secondary | ICD-10-CM

## 2021-04-26 ENCOUNTER — Other Ambulatory Visit: Payer: Self-pay

## 2021-04-26 ENCOUNTER — Other Ambulatory Visit (INDEPENDENT_AMBULATORY_CARE_PROVIDER_SITE_OTHER): Payer: No Typology Code available for payment source

## 2021-04-26 DIAGNOSIS — E269 Hyperaldosteronism, unspecified: Secondary | ICD-10-CM

## 2021-04-26 DIAGNOSIS — C73 Malignant neoplasm of thyroid gland: Secondary | ICD-10-CM | POA: Diagnosis not present

## 2021-04-26 DIAGNOSIS — E876 Hypokalemia: Secondary | ICD-10-CM

## 2021-04-26 LAB — BASIC METABOLIC PANEL
BUN: 17 mg/dL (ref 6–23)
CO2: 28 mEq/L (ref 19–32)
Calcium: 9.9 mg/dL (ref 8.4–10.5)
Chloride: 102 mEq/L (ref 96–112)
Creatinine, Ser: 0.73 mg/dL (ref 0.40–1.20)
GFR: 94.01 mL/min (ref >60.00)
Glucose, Bld: 124 mg/dL — ABNORMAL HIGH (ref 70–99)
Potassium: 4.8 mEq/L (ref 3.5–5.1)
Sodium: 136 mEq/L (ref 135–145)

## 2021-04-26 LAB — TSH: TSH: 0.03 u[IU]/mL — ABNORMAL LOW (ref 0.35–5.50)

## 2021-04-26 LAB — CORTISOL: Cortisol, Plasma: 0.6 ug/dL

## 2021-04-27 LAB — THYROGLOBULIN LEVEL: Thyroglobulin: 0.4 ng/mL — ABNORMAL LOW

## 2021-04-27 LAB — THYROGLOBULIN ANTIBODY: Thyroglobulin Ab: <1 IU/mL (ref <=1)

## 2021-06-14 ENCOUNTER — Encounter: Payer: Self-pay | Admitting: Allergy

## 2021-06-14 ENCOUNTER — Ambulatory Visit (INDEPENDENT_AMBULATORY_CARE_PROVIDER_SITE_OTHER): Payer: No Typology Code available for payment source | Admitting: Allergy

## 2021-06-14 ENCOUNTER — Other Ambulatory Visit: Payer: Self-pay

## 2021-06-14 VITALS — BP 120/76 | HR 98 | Temp 98.2°F | Resp 18

## 2021-06-14 DIAGNOSIS — T781XXD Other adverse food reactions, not elsewhere classified, subsequent encounter: Secondary | ICD-10-CM

## 2021-06-14 DIAGNOSIS — H1013 Acute atopic conjunctivitis, bilateral: Secondary | ICD-10-CM | POA: Diagnosis not present

## 2021-06-14 DIAGNOSIS — J3089 Other allergic rhinitis: Secondary | ICD-10-CM | POA: Diagnosis not present

## 2021-06-14 DIAGNOSIS — J4540 Moderate persistent asthma, uncomplicated: Secondary | ICD-10-CM

## 2021-06-14 DIAGNOSIS — T7800XA Anaphylactic reaction due to unspecified food, initial encounter: Secondary | ICD-10-CM

## 2021-06-14 MED ORDER — LORATADINE 10 MG PO TABS: 10.0000 mg | ORAL_TABLET | Freq: Two times a day (BID) | ORAL | 5 refills | Status: DC | PRN

## 2021-06-14 MED ORDER — FEXOFENADINE HCL 180 MG PO TABS: 180.0000 mg | ORAL_TABLET | Freq: Two times a day (BID) | ORAL | 5 refills | Status: DC | PRN

## 2021-06-14 MED ORDER — ALBUTEROL SULFATE HFA 108 (90 BASE) MCG/ACT IN AERS: 2.0000 | INHALATION_SPRAY | Freq: Four times a day (QID) | RESPIRATORY_TRACT | 2 refills | Status: DC | PRN

## 2021-06-14 MED ORDER — FLUTICASONE-SALMETEROL 250-50 MCG/ACT IN AEPB: 1.0000 | INHALATION_SPRAY | Freq: Two times a day (BID) | RESPIRATORY_TRACT | 5 refills | Status: DC

## 2021-06-14 MED ORDER — EPINEPHRINE 0.3 MG/0.3ML IJ SOAJ: 0.3000 mg | INTRAMUSCULAR | 1 refills | Status: AC | PRN

## 2021-06-14 NOTE — Addendum Note (Signed)
Addended by: Eloy End D on: 06/14/2021 02:16 PM   Modules accepted: Orders

## 2021-06-14 NOTE — Progress Notes (Signed)
Follow-up Note  RE: Maureen Smith MRN: [id number] DOB: 05/04/1966 Date of Office Visit: 06/14/2021   History of present illness: Maureen Smith is a 56 y.o. female presenting today for follow-up of allergic rhinitis, asthma, food allergy and oral allergy syndrome.  She was last seen in the office on 02/09/2019 myself.  She had Covid in December 2022 and states she had fever for 1 day, chills, fatigue and difficulty breathing.  She did take molnupavir.  She states she had symptoms for about 4 days in total.  She still feels short of breath at times, even at rest.  She will use albuterol inhaler and/or nebulizer for this and does help some.  She feels she is needing rescue medication about once a week.  She does feel it is getting better as times passes.  She is still taking Advair (wixela) 2 puffs twice a day but she does need a refill of this.   She does not report any ED or urgent care visits for asthma flare or any systemic steroid needs for an asthma flare.  She is on allergen immunotherapy but states has not come for injection since she had Covid.  Her last immunotherapy injection was on 04/25/2021.  The fatigue she was having around the time of her immunotherapy injection did improved with use of 2 antihistamines prior to allergy injections, day of and day after.  Thus she was tolerting them well prior to getting Covid.  She wants to resume immunotherapy.  It appears however her last dose was in the Silver vial. She is using nasal saline spray/rinse.  She does use antihistamine as above and will use either Allegra or Claritin.  She does avoid nasal steroid spray like Flonase at this time due to increased nasal symptoms with use.  She states she does avoid walnuts/tree nuts but will eat some fruits that will cuase mouth itch.  She states she can avoid everything.  She does have access to an epinephrine device.  She is seeing rheumatologist for evaluation of Raynauds.  She states she has been  having discoloration of her finger tips with wrinkling and white looking appearance.    Review of systems in the past 4 weeks: Review of Systems  Constitutional: Negative.   HENT: Negative.    Eyes: Negative.   Respiratory:  Positive for shortness of breath.   Cardiovascular: Negative.   Gastrointestinal: Negative.   Musculoskeletal: Negative.   Skin: Negative.   Allergic/Immunologic: Negative.   Neurological: Negative.     All other systems negative unless noted above in HPI  Past medical/social/surgical/family history have been reviewed and are unchanged unless specifically indicated below.  No changes  Medication List: Current Outpatient Medications  Medication Sig Dispense Refill   albuterol (VENTOLIN HFA) 108 (90 Base) MCG/ACT inhaler Inhale 2 puffs into the lungs every 6 (six) hours as needed for wheezing or shortness of breath. 18 g 2   amLODipine (NORVASC) 10 MG tablet Take 10 mg by mouth daily.     aspirin EC 81 MG tablet Take 81 mg by mouth every other day.      atorvastatin (LIPITOR) 40 MG tablet Take 1 tablet (40 mg total) by mouth daily. 90 tablet 3   betamethasone dipropionate (DIPROLENE) 0.05 % ointment 1 APPLICATION APPLY ON THE SKIN TWICE A DAY APPLY TO HANDS WITH FLARES. TAPER USE AS ABLE     betamethasone dipropionate 9.48 % lotion 1 APPLICATION APPLY TO THE SCALP DAILY TAPER USE AS ABLE  bisacodyl (DULCOLAX) 10 MG suppository Place 10 mg rectally as needed for moderate constipation.     conjugated estrogens (PREMARIN) vaginal cream Place 1 Applicatorful vaginally every Monday, Wednesday, and Friday.      dicyclomine (BENTYL) 10 MG capsule Take 10 mg by mouth as needed for spasms.     docusate sodium (COLACE) 100 MG capsule Take 100 mg by mouth daily as needed for mild constipation.     eplerenone (INSPRA) 50 MG tablet Take 1 tablet (50 mg total) by mouth daily. 90 tablet 3   fexofenadine (ALLEGRA) 180 MG tablet Take 1 tablet (180 mg total) by mouth 2 (two)  times daily as needed for allergies or rhinitis (Take two on allergy shot days). 60 tablet 5   fluconazole (DIFLUCAN) 100 MG tablet Take 1 tablet (100 mg total) by mouth as directed. Two tablets on day one, then 1 tablet daily after that 8 tablet 0   fluocinolone (VANOS) 0.01 % cream Apply topically as needed.     fluticasone (FLONASE) 50 MCG/ACT nasal spray Place 1 spray into both nostrils daily as needed for allergies. 16 g 5   Fluticasone-Salmeterol (ADVAIR DISKUS) 250-50 MCG/DOSE AEPB Inhale 1 puff into the lungs 2 (two) times daily. 1 each 5   fluticasone-salmeterol (WIXELA INHUB) 250-50 MCG/ACT AEPB Inhale 1 puff into the lungs in the morning and at bedtime. 60 each 5   ketoconazole (NIZORAL) 2 % shampoo Apply 1 application topically as directed. 120 mL 0   ketotifen (ZADITOR) 0.025 % ophthalmic solution 1 drop 2 (two) times daily.     levothyroxine (SYNTHROID) 125 MCG tablet Take 1 tablet (125 mcg total) by mouth daily before breakfast. 90 tablet 1   loratadine (CLARITIN) 10 MG tablet Take 1 tablet (10 mg total) by mouth 2 (two) times daily as needed for allergies (Take twiceon allergy shot days). 60 tablet 5   loratadine (CLARITIN) 10 MG tablet Take 1 tablet (10 mg total) by mouth daily. 30 tablet 5   olopatadine (PATANOL) 0.1 % ophthalmic solution Place 1 drop into both eyes 2 (two) times daily. 5 mL 5   omeprazole (PRILOSEC) 20 MG capsule Take 20 mg by mouth daily.     omeprazole (PRILOSEC) 40 MG capsule Take 40 mg by mouth daily.     oxybutynin (DITROPAN-XL) 10 MG 24 hr tablet Take 10 mg by mouth at bedtime.     potassium chloride SA (KLOR-CON) 20 MEQ tablet Take 1 tablet (20 mEq total) by mouth daily. 90 tablet 1   tacrolimus (PROTOPIC) 0.1 % ointment 1 APPLICATION APPLY ON THE SKIN TWICE A DAY APPLY TO AREAS ON FACE/EYELIDS     triamcinolone cream (KENALOG) 0.1 % Apply 1 application topically 2 (two) times daily as needed (skin irritation).      Vitamin D, Ergocalciferol, (DRISDOL)  50000 UNITS CAPS capsule Take 50,000 Units by mouth every Wednesday.   0   No current facility-administered medications for this visit.     Known medication allergies: Allergies  Allergen Reactions   Amoxicillin-Pot Clavulanate Nausea And Vomiting and Other (See Comments)    Did it involve swelling of the face/tongue/throat, SOB, or low BP? No Did it involve sudden or severe rash/hives, skin peeling, or any reaction on the inside of your mouth or nose? No Did you need to seek medical attention at a hospital or doctor's office? Less than 10 years When did it last happen?       If all above answers are NO, may proceed  with cephalosporin use.  Did it involve swelling of the face/tongue/throat, SOB, or low BP? No Did it involve sudden or severe rash/hives, skin peeling, or any reaction on the inside of your mouth or nose? No Did you need to seek medical attention at a hospital or doctor's office? Less than 10 years When did it last happen?       If all above answers are NO, may proceed with cephalosporin use.    Spironolactone     Other reaction(s): Engorgement of breasts   Gold Sodium Thiosulfate Other (See Comments)    Positive patch test   Morphine Nausea And Vomiting     Physical examination: Blood pressure 120/76, pulse 98, temperature 98.2 F (36.8 C), temperature source Temporal, resp. rate 18.  General: Alert, interactive, in no acute distress. HEENT: PERRLA, TMs pearly gray, turbinates mildly edematous without discharge, post-pharynx non erythematous. Neck: Supple without lymphadenopathy. Lungs: Clear to auscultation without wheezing, rhonchi or rales. {no increased work of breathing. CV: Normal S1, S2 without murmurs. Abdomen: Nondistended, nontender. Skin: Warm and dry, without lesions or rashes. Extremities:  No clubbing, cyanosis or edema. Neuro:   Grossly intact.  Diagnositics/Labs: Spirometry: FEV1: 1.37 L 58%, FVC: 1.76 L 60% predicted.  This has a  restrictive pattern is reduced from previous study.  She states she felt like she was not able to blow into the device today and she has been feeling more short of breath.   Assessment and plan:   Allergic rhinitis - take Allegra or Claritin daily.   On your allergy shot days recommend you take Allegra twice a day the day before, day of your injection and following day.  Return to daily use the rest of the week.    - continue avoidance measures for grasses, weeds, trees, molds, dust mite, cat  - continue nasal saline spray/rinse as needed.   - resume allergen immunotherapy and carry epinephrine autoinjector with you at all times.  She has not really progressed much due to inabilities to get to the office for her injections and previous issues with advancement.  Asthma  - continue Wixela  265mcg  take 1 puffs twice a day  - have access to albuterol inhaler 2 puffs or albuterol nebulizer every 4-6 hours as needed for cough/wheeze/shortness of breath/chest tightness.  May use 15-20 minutes prior to activity.   Monitor frequency of use.    -Lung function is low today however this may be reflective from recent COVID illness.   Asthma control goals:  Full participation in all desired activities (may need albuterol before activity) Albuterol use two time or less a week on average (not counting use with activity) Cough interfering with sleep two time or less a month Oral steroids no more than once a year No hospitalizations  Food allergy  - continue your current food avoidance   - have access to self-injectable epinephrine Epipen 0.3mg  at all times  - follow emergency action plan in case of allergic reaction  Oral allergy syndrome Avoid fruits that cause itching in your mouth - The oral allergy syndrome (OAS) or pollen-food allergy syndrome (PFAS) is a relatively common form of food allergy, particularly in adults. It typically occurs in people who have pollen allergies when the immune system  "sees" proteins on the food that look like proteins on the pollen. This results in the allergy antibody (IgE) binding to the food instead of the pollen. Patients typically report itching and/or mild swelling of the mouth and throat immediately  following ingestion of certain uncooked fruits (including nuts) or raw vegetables. Only a very small number of affected individuals experience systemic allergic reactions, such as anaphylaxis which occurs with true food allergies.    Follow-up in 4-6 months or sooner if needed  I appreciate the opportunity to take part in Louis's care. Please do not hesitate to contact me with questions.  Sincerely,   Prudy Feeler, MD Allergy/Immunology [facility]

## 2021-06-14 NOTE — Patient Instructions (Signed)
Allergic rhinitis - take Allegra or Claritin daily.   On your allergy shot days recommend you take Allegra twice a day the day before, day of your injection and following day.  Return to daily use the rest of the week.    - continue avoidance measures for grasses, weeds, trees, molds, dust mite, cat  - continue nasal saline spray/rinse as needed.   - resume allergen immunotherapy and carry epinephrine autoinjector with you at all times  Asthma  - continue Advair (Wixela) HFA  230/21 take 2 puffs twice a day with spacer device  - have access to albuterol inhaler 2 puffs or albuterol nebulizer every 4-6 hours as needed for cough/wheeze/shortness of breath/chest tightness.  May use 15-20 minutes prior to activity.   Monitor frequency of use.     Asthma control goals:  Full participation in all desired activities (may need albuterol before activity) Albuterol use two time or less a week on average (not counting use with activity) Cough interfering with sleep two time or less a month Oral steroids no more than once a year No hospitalizations  Food allergy  - continue your current food avoidance   - have access to self-injectable epinephrine Epipen 0.3mg  at all times  - follow emergency action plan in case of allergic reaction  Oral allergy syndrome Avoid fruits that cause itching in your mouth - The oral allergy syndrome (OAS) or pollen-food allergy syndrome (PFAS) is a relatively common form of food allergy, particularly in adults. It typically occurs in people who have pollen allergies when the immune system "sees" proteins on the food that look like proteins on the pollen. This results in the allergy antibody (IgE) binding to the food instead of the pollen. Patients typically report itching and/or mild swelling of the mouth and throat immediately following ingestion of certain uncooked fruits (including nuts) or raw vegetables. Only a very small number of affected individuals experience  systemic allergic reactions, such as anaphylaxis which occurs with true food allergies.         Follow-up in 4-6 months or sooner if needed

## 2021-06-15 ENCOUNTER — Encounter: Payer: Self-pay | Admitting: Internal Medicine

## 2021-06-15 ENCOUNTER — Encounter: Payer: Self-pay | Admitting: Allergy & Immunology

## 2021-06-15 ENCOUNTER — Ambulatory Visit (INDEPENDENT_AMBULATORY_CARE_PROVIDER_SITE_OTHER): Payer: No Typology Code available for payment source

## 2021-06-15 DIAGNOSIS — J309 Allergic rhinitis, unspecified: Secondary | ICD-10-CM

## 2021-06-22 ENCOUNTER — Ambulatory Visit (INDEPENDENT_AMBULATORY_CARE_PROVIDER_SITE_OTHER): Payer: No Typology Code available for payment source

## 2021-06-22 ENCOUNTER — Encounter: Payer: Self-pay | Admitting: Allergy

## 2021-06-22 DIAGNOSIS — J309 Allergic rhinitis, unspecified: Secondary | ICD-10-CM | POA: Diagnosis not present

## 2021-07-06 ENCOUNTER — Encounter: Payer: Self-pay | Admitting: Allergy

## 2021-07-06 ENCOUNTER — Ambulatory Visit (INDEPENDENT_AMBULATORY_CARE_PROVIDER_SITE_OTHER): Payer: No Typology Code available for payment source

## 2021-07-06 DIAGNOSIS — J309 Allergic rhinitis, unspecified: Secondary | ICD-10-CM | POA: Diagnosis not present

## 2021-07-18 ENCOUNTER — Encounter: Payer: Self-pay | Admitting: Allergy & Immunology

## 2021-07-18 ENCOUNTER — Ambulatory Visit (INDEPENDENT_AMBULATORY_CARE_PROVIDER_SITE_OTHER): Payer: No Typology Code available for payment source

## 2021-07-18 DIAGNOSIS — J309 Allergic rhinitis, unspecified: Secondary | ICD-10-CM | POA: Diagnosis not present

## 2021-07-27 NOTE — Progress Notes (Signed)
VIALS EXP 07-28-22 ?

## 2021-07-28 ENCOUNTER — Ambulatory Visit (INDEPENDENT_AMBULATORY_CARE_PROVIDER_SITE_OTHER): Payer: No Typology Code available for payment source

## 2021-07-28 ENCOUNTER — Encounter: Payer: Self-pay | Admitting: Allergy

## 2021-07-28 DIAGNOSIS — J309 Allergic rhinitis, unspecified: Secondary | ICD-10-CM | POA: Diagnosis not present

## 2021-07-31 DIAGNOSIS — J3081 Allergic rhinitis due to animal (cat) (dog) hair and dander: Secondary | ICD-10-CM | POA: Diagnosis not present

## 2021-08-01 DIAGNOSIS — J3089 Other allergic rhinitis: Secondary | ICD-10-CM | POA: Diagnosis not present

## 2021-08-02 IMAGING — US US THYROID
1 series · 13 of 25 positions shown · non-contrast
Comparison: 09/10/2019

CLINICAL DATA: Other. Post total thyroidectomy for papillary
thyroid cancer.

EXAM:
THYROID ULTRASOUND
TECHNIQUE: Ultrasound examination of the thyroid gland and adjacent soft
tissues was performed.

[Series 1: us thyroid · 0.05mm/px · 13 of 32 slices shown]
[im 1/32]
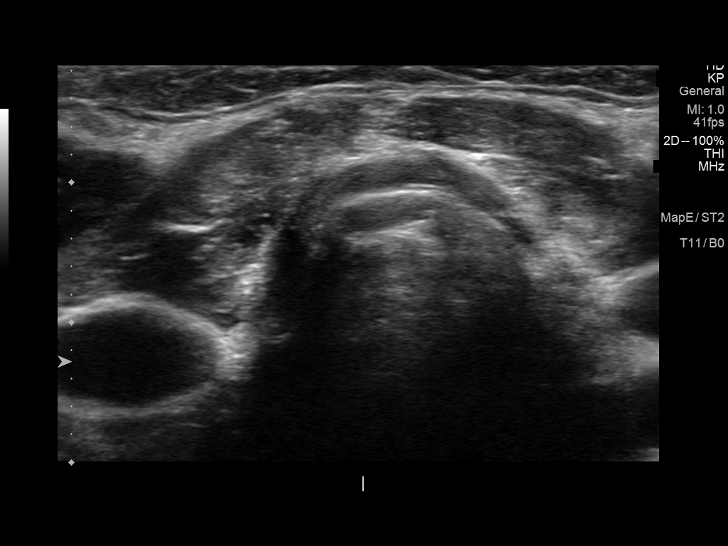
[im 3/32]
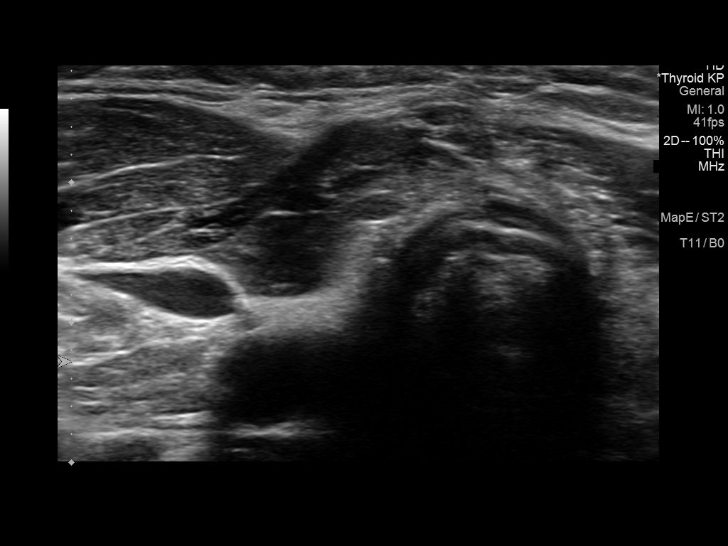
[im 6/32]
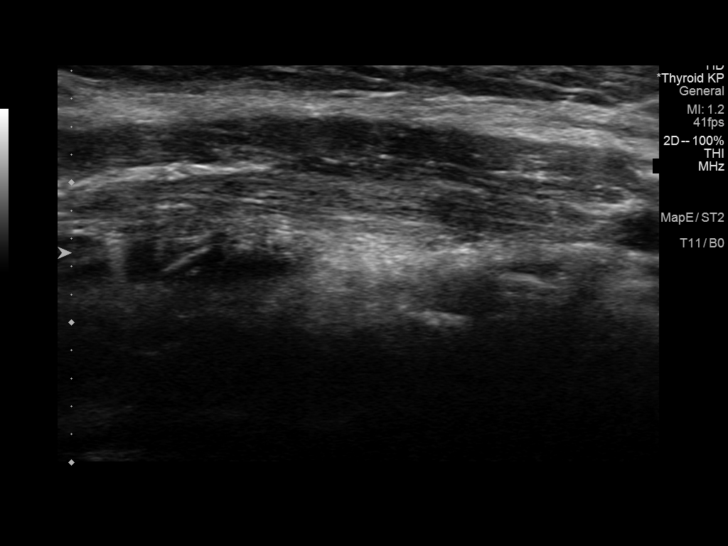
[im 8/32]
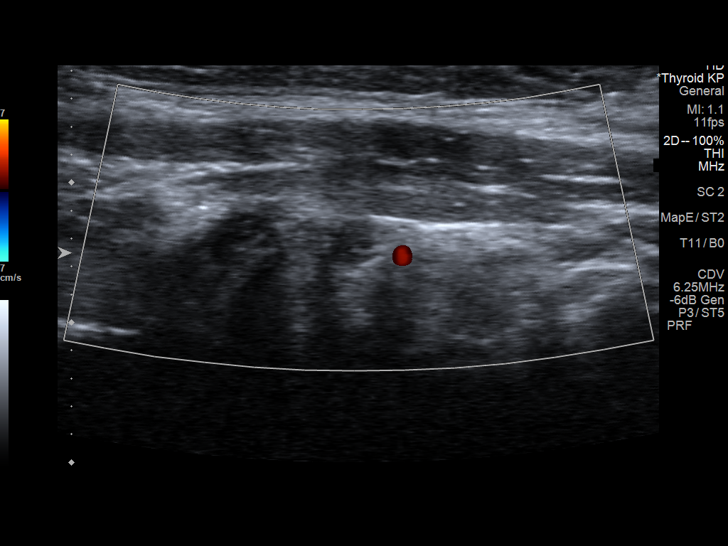
[im 11/32]
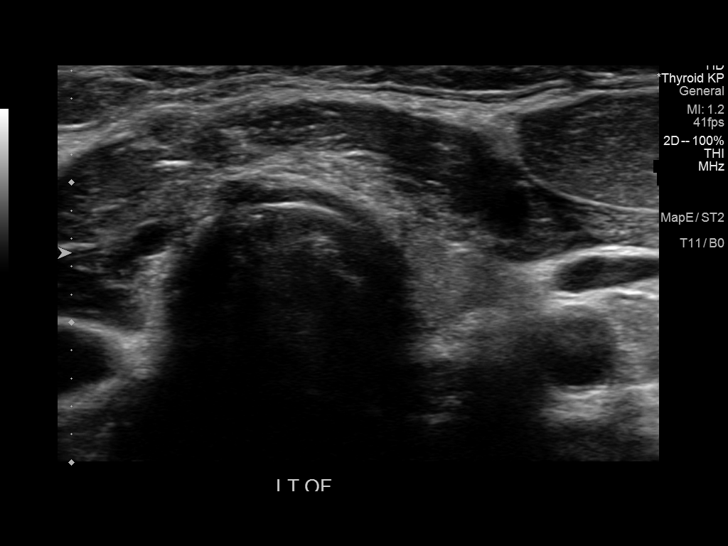
[im 13/32]
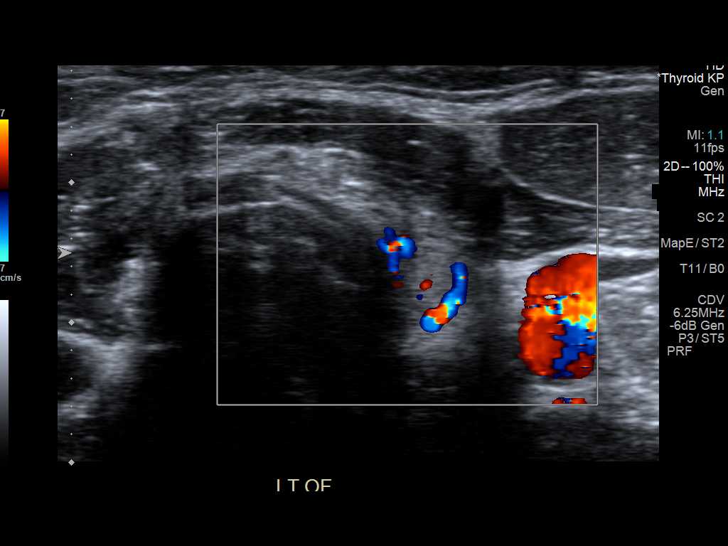
[im 16/32]
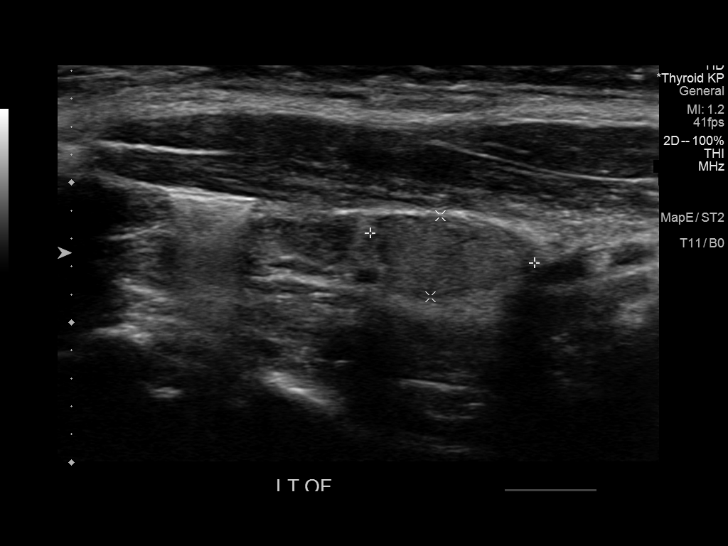
[im 19/32]
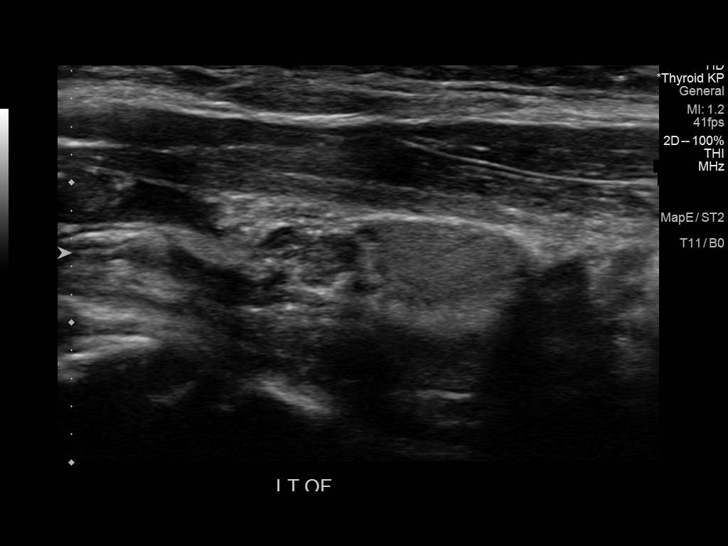
[im 21/32]
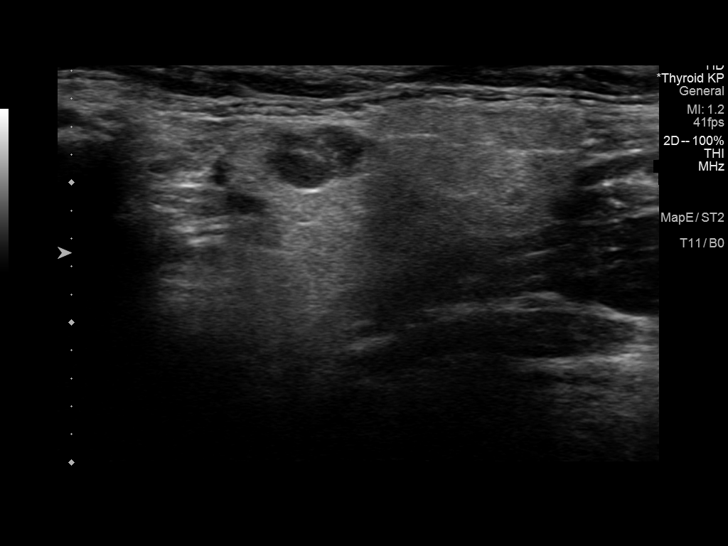
[im 24/32]
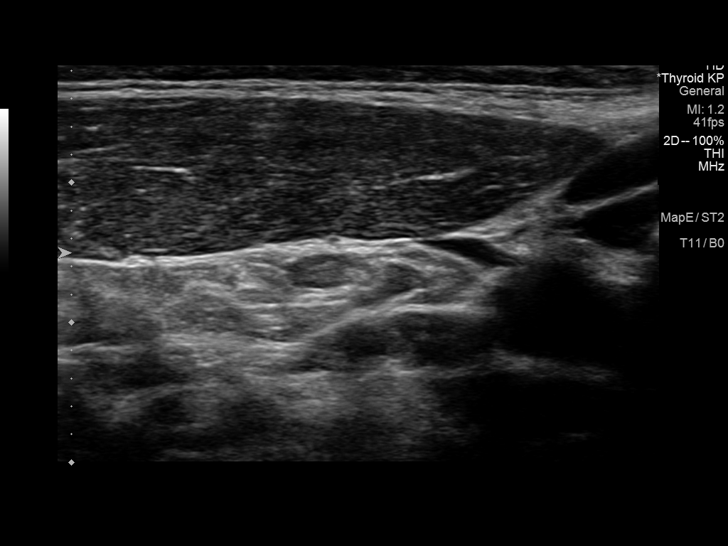
[im 26/32]
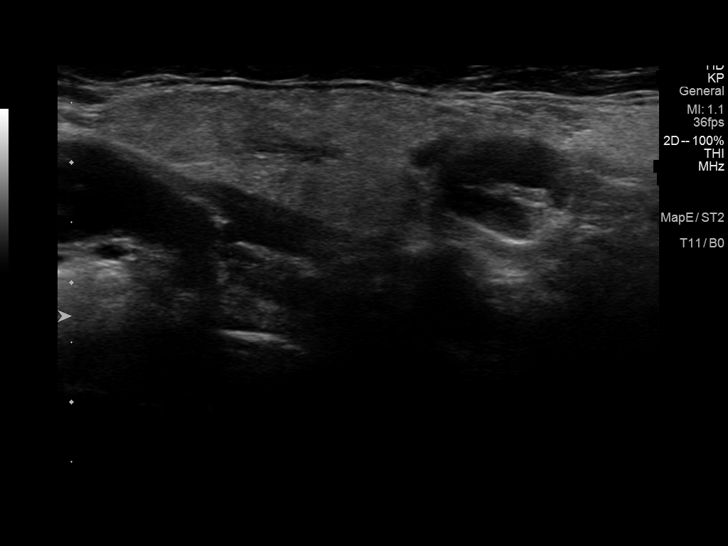
[im 29/32]
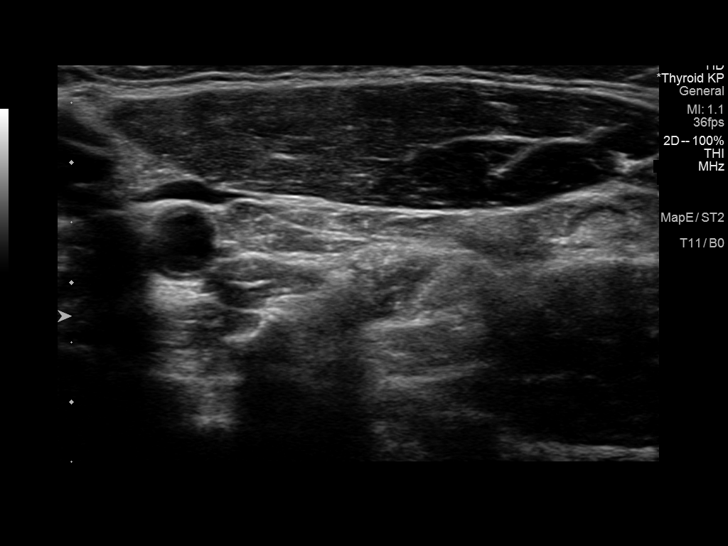
[im 32/32]
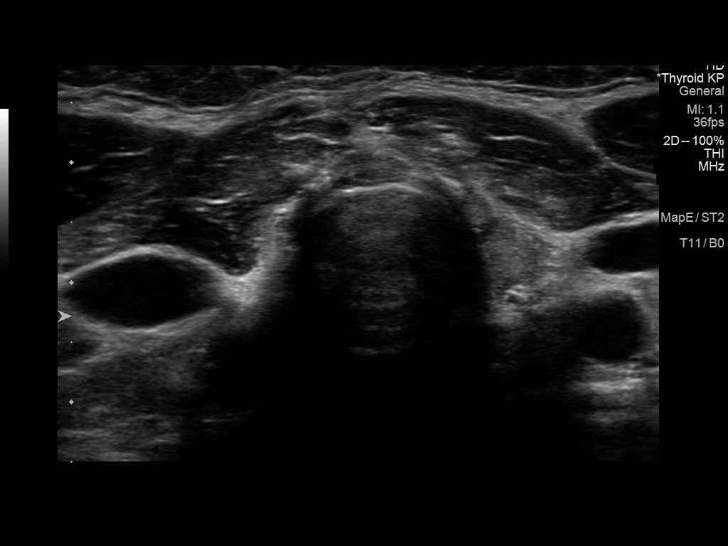

[13 of 25 positions shown; findings below may reference images not displayed]

FINDINGS: Isthmus: Surgically absent. There is no residual nodular soft tissue
within the isthmic resection bed.

Right lobe: Surgically absent. There is no residual nodular soft
tissue within the right lobectomy resection bed.

Left lobe: Nodular soft tissue remains within the left lobectomy
resection bed measuring approximately 1.2 x 0.6 x 0.7 cm, unchanged
compared to the 09/10/2019 examination, previously, 1.2 x 0.6 x
cm

_________________________________________________________

Previously noted mildly prominent though non pathologically enlarged
bilateral cervical lymph nodes have slightly decreased in size
compared to the 09/10/2019 examination with index left submandibular
chain lymph node measuring 0.8 cm in greatest short axis diameter
(image 28), previously, 0.9 cm and index right submandibular chain
lymph node now measuring 0.4 cm (image 24), previously, 0.9 cm.
IMPRESSION: 1. The approximately 1.2 cm nodular soft tissue within the left
lobectomy resection bed is unchanged compared to the 09/10/2019
examination with differential considerations again including
residual thyroid parenchyma versus residual/recurrent disease.
2. Presumably reactive bilateral cervical lymph nodes, decreased in
size compared to the [DATE] examination.

## 2021-08-07 ENCOUNTER — Encounter: Payer: Self-pay | Admitting: Allergy

## 2021-08-07 ENCOUNTER — Ambulatory Visit (INDEPENDENT_AMBULATORY_CARE_PROVIDER_SITE_OTHER): Payer: No Typology Code available for payment source

## 2021-08-07 DIAGNOSIS — J309 Allergic rhinitis, unspecified: Secondary | ICD-10-CM | POA: Diagnosis not present

## 2021-08-18 ENCOUNTER — Encounter: Payer: Self-pay | Admitting: Allergy

## 2021-08-18 ENCOUNTER — Ambulatory Visit (INDEPENDENT_AMBULATORY_CARE_PROVIDER_SITE_OTHER): Payer: No Typology Code available for payment source

## 2021-08-18 DIAGNOSIS — J309 Allergic rhinitis, unspecified: Secondary | ICD-10-CM | POA: Diagnosis not present

## 2021-08-30 ENCOUNTER — Encounter: Payer: Self-pay | Admitting: Allergy

## 2021-08-30 ENCOUNTER — Ambulatory Visit (INDEPENDENT_AMBULATORY_CARE_PROVIDER_SITE_OTHER): Payer: No Typology Code available for payment source | Admitting: *Deleted

## 2021-08-30 DIAGNOSIS — J309 Allergic rhinitis, unspecified: Secondary | ICD-10-CM | POA: Diagnosis not present

## 2021-09-01 ENCOUNTER — Encounter: Payer: Self-pay | Admitting: Internal Medicine

## 2021-09-01 ENCOUNTER — Ambulatory Visit (INDEPENDENT_AMBULATORY_CARE_PROVIDER_SITE_OTHER): Payer: No Typology Code available for payment source | Admitting: Internal Medicine

## 2021-09-01 VITALS — BP 128/82 | HR 90 | Ht 61.0 in | Wt 159.8 lb

## 2021-09-01 DIAGNOSIS — E890 Postprocedural hypothyroidism: Secondary | ICD-10-CM | POA: Diagnosis not present

## 2021-09-01 DIAGNOSIS — C73 Malignant neoplasm of thyroid gland: Secondary | ICD-10-CM

## 2021-09-01 DIAGNOSIS — E269 Hyperaldosteronism, unspecified: Secondary | ICD-10-CM | POA: Diagnosis not present

## 2021-09-01 LAB — BASIC METABOLIC PANEL
BUN: 12 mg/dL (ref 6–23)
CO2: 28 mEq/L (ref 19–32)
Calcium: 8.8 mg/dL (ref 8.4–10.5)
Chloride: 101 mEq/L (ref 96–112)
Creatinine, Ser: 0.68 mg/dL (ref 0.40–1.20)
GFR: 99.32 mL/min (ref >60.00)
Glucose, Bld: 118 mg/dL — ABNORMAL HIGH (ref 70–99)
Potassium: 4.1 mEq/L (ref 3.5–5.1)
Sodium: 137 mEq/L (ref 135–145)

## 2021-09-01 LAB — TSH: TSH: 0.03 u[IU]/mL — ABNORMAL LOW (ref 0.35–5.50)

## 2021-09-01 MED ORDER — EPLERENONE 50 MG PO TABS: 50.0000 mg | ORAL_TABLET | Freq: Every day | ORAL | 3 refills | Status: AC

## 2021-09-01 MED ORDER — LEVOTHYROXINE SODIUM 125 MCG PO TABS: 125.0000 ug | ORAL_TABLET | Freq: Every day | ORAL | 3 refills | Status: DC
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
## 2021-09-04 LAB — THYROGLOBULIN ANTIBODY: Thyroglobulin Ab: <1 IU/mL (ref <=1)

## 2021-09-04 LAB — THYROGLOBULIN LEVEL: Thyroglobulin: 0.5 ng/mL — ABNORMAL LOW

## 2021-09-05 ENCOUNTER — Ambulatory Visit (INDEPENDENT_AMBULATORY_CARE_PROVIDER_SITE_OTHER): Payer: No Typology Code available for payment source

## 2021-09-05 ENCOUNTER — Encounter: Payer: Self-pay | Admitting: Allergy & Immunology

## 2021-09-05 DIAGNOSIS — J309 Allergic rhinitis, unspecified: Secondary | ICD-10-CM | POA: Diagnosis not present

## 2021-09-06 ENCOUNTER — Ambulatory Visit
Admission: RE | Admit: 2021-09-06 | Discharge: 2021-09-06 | Disposition: A | Discharge status: Home / Self Care | Payer: No Typology Code available for payment source | Source: Ambulatory Visit | Attending: Internal Medicine | Admitting: Internal Medicine

## 2021-09-06 DIAGNOSIS — C73 Malignant neoplasm of thyroid gland: Secondary | ICD-10-CM

## 2021-09-19 ENCOUNTER — Ambulatory Visit (INDEPENDENT_AMBULATORY_CARE_PROVIDER_SITE_OTHER): Payer: No Typology Code available for payment source

## 2021-09-19 ENCOUNTER — Encounter: Payer: Self-pay | Admitting: Allergy & Immunology

## 2021-09-19 DIAGNOSIS — J309 Allergic rhinitis, unspecified: Secondary | ICD-10-CM | POA: Diagnosis not present

## 2021-09-26 ENCOUNTER — Ambulatory Visit
Admission: RE | Admit: 2021-09-26 | Discharge: 2021-09-26 | Disposition: A | Discharge status: Home / Self Care | Payer: No Typology Code available for payment source | Source: Ambulatory Visit | Attending: Internal Medicine | Admitting: Internal Medicine

## 2021-09-26 DIAGNOSIS — E269 Hyperaldosteronism, unspecified: Secondary | ICD-10-CM

## 2021-09-26 MED ORDER — IOPAMIDOL (ISOVUE-300) INJECTION 61%
100.0000 mL | Freq: Once | INTRAVENOUS | Status: AC | PRN
  Administered 2021-09-26: 100 mL via INTRAVENOUS

## 2021-09-26 MED ORDER — IOPAMIDOL (ISOVUE-300) INJECTION 61%: 100.0000 mL | Freq: Once | INTRAVENOUS | Status: DC | PRN

## 2021-09-27 ENCOUNTER — Encounter: Payer: Self-pay | Admitting: Internal Medicine

## 2021-09-27 ENCOUNTER — Ambulatory Visit (INDEPENDENT_AMBULATORY_CARE_PROVIDER_SITE_OTHER): Payer: No Typology Code available for payment source

## 2021-09-27 DIAGNOSIS — J309 Allergic rhinitis, unspecified: Secondary | ICD-10-CM

## 2021-09-28 ENCOUNTER — Encounter: Payer: Self-pay | Admitting: Internal Medicine

## 2021-09-29 ENCOUNTER — Encounter: Payer: Self-pay | Admitting: Internal Medicine

## 2021-09-29 ENCOUNTER — Telehealth: Payer: Self-pay | Admitting: Internal Medicine

## 2021-09-29 DIAGNOSIS — R9341 Abnormal radiologic findings on diagnostic imaging of renal pelvis, ureter, or bladder: Secondary | ICD-10-CM

## 2021-09-29 NOTE — Telephone Encounter (Signed)
Attempted to contact the pt to discuss CT scan results.  ? ? ?A portal message will be sent ?A referral to urology has been placed ? ? ?Mack Guise, MD ? ?[facility]  ?[facility] ?[address] ?[place], [place] [address] ?Phone: [phone] ?FAX: [phone] ? ?
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
## 2021-10-17 ENCOUNTER — Ambulatory Visit (INDEPENDENT_AMBULATORY_CARE_PROVIDER_SITE_OTHER): Payer: No Typology Code available for payment source | Admitting: Family

## 2021-10-17 ENCOUNTER — Encounter (HOSPITAL_BASED_OUTPATIENT_CLINIC_OR_DEPARTMENT_OTHER): Payer: Self-pay | Admitting: Family

## 2021-10-17 VITALS — BP 118/84 | HR 89 | Ht 61.0 in | Wt 163.0 lb

## 2021-10-17 DIAGNOSIS — I4510 Unspecified right bundle-branch block: Secondary | ICD-10-CM | POA: Diagnosis not present

## 2021-10-17 DIAGNOSIS — E782 Mixed hyperlipidemia: Secondary | ICD-10-CM | POA: Diagnosis not present

## 2021-10-17 DIAGNOSIS — R079 Chest pain, unspecified: Secondary | ICD-10-CM

## 2021-10-17 DIAGNOSIS — I10 Essential (primary) hypertension: Secondary | ICD-10-CM | POA: Diagnosis not present

## 2021-10-17 NOTE — Patient Instructions (Signed)
Medication Instructions:  Continue your current medications.   *If you need a refill on your cardiac medications before your next appointment, please call your pharmacy*   Lab Work: None ordered today.   Testing/Procedures: Your EKG today showed normal sinus rhythm which is stable compared to previous   Follow-Up: At [facility], you and your health needs are our priority.  As part of our continuing mission to provide you with exceptional heart care, we have created designated Provider Care Teams.  These Care Teams include your primary Cardiologist (physician) and Advanced Practice Providers (APPs -  Physician Assistants and Nurse Practitioners) who all work together to provide you with the care you need, when you need it.  We recommend signing up for the patient portal called "MyChart".  Sign up information is provided on this After Visit Summary.  MyChart is used to connect with patients for Virtual Visits (Telemedicine).  Patients are able to view lab/test results, encounter notes, upcoming appointments, etc.  Non-urgent messages can be sent to your provider as well.   To learn more about what you can do with MyChart, go to [email or web address].    Your next appointment:   1 year(s)  The format for your next appointment:   In Person  Provider:   Buford Dresser, MD or Loel Dubonnet, NP     Other Instructions  Heart Healthy Diet Recommendations: A low-salt diet is recommended. Meats should be grilled, baked, or boiled. Avoid fried foods. Focus on lean protein sources like fish or chicken with vegetables and fruits. The American Heart Association is a Microbiologist!  American Heart Association Diet and Lifeystyle Recommendations   Exercise recommendations: The American Heart Association recommends 150 minutes of moderate intensity exercise weekly. Try 30 minutes of moderate intensity exercise 4-5 times per week. This could include walking, jogging, or  swimming.  Exercises to do While Sitting  Exercises that you do while sitting (chair exercises) can give you many of the same benefits as full exercise. Benefits include strengthening your heart, burning calories, and keeping muscles and joints healthy. Exercise can also improve your mood and help with depression and anxiety. You may benefit from chair exercises if you are unable to do standing exercises due to: Diabetic foot pain. Obesity. Illness. Arthritis. Recovery from surgery or injury. Breathing problems. Balance problems. Another type of disability. Before starting chair exercises, check with your health care provider or a physical therapist to find out how much exercise you can tolerate and which exercises are safe for you. If your health care provider approves: Start out slowly and build up over time. Aim to work up to about 10-20 minutes for each exercise session. Make exercise part of your daily routine. Drink water when you exercise. Do not wait until you are thirsty. Drink every 10-15 minutes. Stop exercising right away if you have pain, nausea, shortness of breath, or dizziness. If you are exercising in a wheelchair, make sure to lock the wheels. Ask your health care provider whether you can do tai chi or yoga. Many positions in these mind-body exercises can be modified to do while seated. Warm-up Before starting other exercises: Sit up as straight as you can. Have your knees bent at 90 degrees, which is the shape of the capital letter "L." Keep your feet flat on the floor. Sit at the front edge of your chair, if you can. Pull in (tighten) the muscles in your abdomen and stretch your spine and neck as straight as you can.  Hold this position for a few minutes. Breathe in and out evenly. Try to concentrate on your breathing, and relax your mind. Stretching Exercise A: Arm stretch Hold your arms out straight in front of your body. Bend your hands at the wrist with your  fingers pointing up, as if signaling someone to stop. Notice the slight tension in your forearms as you hold the position. Keeping your arms out and your hands bent, rotate your hands outward as far as you can and hold this stretch. Aim to have your thumbs pointing up and your pinkie fingers pointing down. Slowly repeat arm stretches for one minute as tolerated. Exercise B: Leg stretch If you can move your legs, try to "draw" letters on the floor with the toes of your foot. Write your name with one foot. Write your name with the toes of your other foot. Slowly repeat the movements for one minute as tolerated. Exercise C: Reach for the sky Reach your hands as far over your head as you can to stretch your spine. Move your hands and arms as if you are climbing a rope. Slowly repeat the movements for one minute as tolerated. Range of motion exercises Exercise A: Shoulder roll Let your arms hang loosely at your sides. Lift just your shoulders up toward your ears, then let them relax back down. When your shoulders feel loose, rotate your shoulders in backward and forward circles. Do shoulder rolls slowly for one minute as tolerated. Exercise B: March in place As if you are marching, pump your arms and lift your legs up and down. Lift your knees as high as you can. If you are unable to lift your knees, just pump your arms and move your ankles and feet up and down. March in place for one minute as tolerated. Exercise C: Seated jumping jacks Let your arms hang down straight. Keeping your arms straight, lift them up over your head. Aim to point your fingers to the ceiling. While you lift your arms, straighten your legs and slide your heels along the floor to your sides, as wide as you can. As you bring your arms back down to your sides, slide your legs back together. If you are unable to use your legs, just move your arms. Slowly repeat seated jumping jacks for one minute as  tolerated. Strengthening exercises Exercise A: Shoulder squeeze Hold your arms straight out from your body to your sides, with your elbows bent and your fists pointed at the ceiling. Keeping your arms in the bent position, move them forward so your elbows and forearms meet in front of your face. Open your arms back out as wide as you can with your elbows still bent, until you feel your shoulder blades squeezing together. Hold for 5 seconds. Slowly repeat the movements forward and backward for one minute as tolerated. Contact a health care provider if: You have to stop exercising due to any of the following: Pain. Nausea. Shortness of breath. Dizziness. Fatigue. You have significant pain or soreness after exercising. Get help right away if: You have chest pain. You have difficulty breathing. These symptoms may represent a serious problem that is an emergency. Do not wait to see if the symptoms will go away. Get medical help right away. Call your local emergency services (911 in the [place] Do not drive yourself to the hospital. Summary Exercises that you do while sitting (chair exercises) can strengthen your heart, burn calories, and keep muscles and joints healthy. You may benefit from  chair exercises if you are unable to do standing exercises due to diabetic foot pain, obesity, recovery from surgery or injury, or other conditions. Before starting chair exercises, check with your health care provider or a physical therapist to find out how much exercise you can tolerate and which exercises are safe for you. This information is not intended to replace advice given to you by your health care provider. Make sure you discuss any questions you have with your health care provider. Document Revised: 07/03/2020 Document Reviewed: 07/03/2020 Elsevier Patient Education  [place]

## 2021-10-18 ENCOUNTER — Ambulatory Visit (INDEPENDENT_AMBULATORY_CARE_PROVIDER_SITE_OTHER): Payer: No Typology Code available for payment source

## 2021-10-18 ENCOUNTER — Ambulatory Visit: Payer: No Typology Code available for payment source | Admitting: Internal Medicine

## 2021-10-18 ENCOUNTER — Encounter: Payer: Self-pay | Admitting: Allergy

## 2021-10-18 DIAGNOSIS — J309 Allergic rhinitis, unspecified: Secondary | ICD-10-CM | POA: Diagnosis not present

## 2021-10-27 ENCOUNTER — Encounter: Payer: Self-pay | Admitting: Allergy

## 2021-10-27 ENCOUNTER — Ambulatory Visit (INDEPENDENT_AMBULATORY_CARE_PROVIDER_SITE_OTHER): Payer: No Typology Code available for payment source

## 2021-10-27 DIAGNOSIS — J309 Allergic rhinitis, unspecified: Secondary | ICD-10-CM

## 2021-11-08 HISTORY — PX: COLONOSCOPY: SHX174

## 2021-11-08 HISTORY — PX: UPPER GI ENDOSCOPY: SHX6162

## 2021-11-09 ENCOUNTER — Telehealth: Payer: Self-pay

## 2021-11-09 NOTE — Telephone Encounter (Signed)
Noted! Thank you

## 2021-11-09 NOTE — Telephone Encounter (Signed)
Patient called and stated that she wanted to stop her allergy injections due to her feeling as if her blood sugars were dropping. I verbally asked Dr. Nelva Bush if this is was possible and she said no. I notified patient and she wants to discuss other choices for immunotherapy as she doesn't like this feeling. As I looked I saw that she gets the epiwash and informed her that the epi could be giving her the feeling as well as it opens thing internally to prevent reactions. I expressed to patient that if she was willing and comfortable she can ask to not have the epiwash and see how she feels to confirm or deny the feeling she is experiencing. Patient stated that she would like to speak to a provider and before testing this out. I have scheduled patient for Monday with Altha Harm dale as Dr. Nelva Bush is booked put a while. Please advise if something should be done prior to her appointment.

## 2021-11-13 ENCOUNTER — Telehealth: Payer: Self-pay

## 2021-11-13 ENCOUNTER — Ambulatory Visit (INDEPENDENT_AMBULATORY_CARE_PROVIDER_SITE_OTHER): Payer: No Typology Code available for payment source | Admitting: Family

## 2021-11-13 ENCOUNTER — Encounter: Payer: Self-pay | Admitting: Family

## 2021-11-13 DIAGNOSIS — H1013 Acute atopic conjunctivitis, bilateral: Secondary | ICD-10-CM | POA: Diagnosis not present

## 2021-11-13 DIAGNOSIS — J3089 Other allergic rhinitis: Secondary | ICD-10-CM | POA: Diagnosis not present

## 2021-11-13 DIAGNOSIS — J4540 Moderate persistent asthma, uncomplicated: Secondary | ICD-10-CM | POA: Diagnosis not present

## 2021-11-13 DIAGNOSIS — T781XXD Other adverse food reactions, not elsewhere classified, subsequent encounter: Secondary | ICD-10-CM

## 2021-11-13 DIAGNOSIS — T7800XA Anaphylactic reaction due to unspecified food, initial encounter: Secondary | ICD-10-CM

## 2021-11-13 NOTE — Telephone Encounter (Signed)
Patient had a tele-visit today with dale. She is going to be discontinuing allergy shots due to the reactions she frequently gets from them. Patient has been informed by Amada Jupiter that she needs to come to the [place] office and sign forms to discontinue shots.

## 2021-11-15 ENCOUNTER — Telehealth: Payer: Self-pay | Admitting: Internal Medicine

## 2021-11-15 MED ORDER — DOXYCYCLINE MONOHYDRATE 100 MG PO TABS
100.0000 mg | ORAL_TABLET | Freq: Two times a day (BID) | ORAL | 0 refills | Status: AC
Start: 2021-11-15 — End: 2021-11-22

## 2021-11-15 NOTE — Telephone Encounter (Signed)
Called patient - NO DPR on file - LMOVM to contact office regarding her symptoms.

## 2021-11-15 NOTE — Addendum Note (Signed)
Addended by: Clovis Cao A on: 11/15/2021 05:52 PM   Modules accepted: Orders

## 2021-11-15 NOTE — Telephone Encounter (Signed)
Please ask if she can tolerate doxycycline. If she can tolerate doxycycline, please send in a prescription for doxycycline 100 mg-take 1 tablet twice a day for 7 days. Quantity 14 tablets with no refills.

## 2021-11-15 NOTE — Telephone Encounter (Signed)
Patient called and said she is worse, she thinks she has a sinus infection, drainage, green mucus, losing her voice. She said that she may need a antibiotic [facility] . [phone]

## 2021-11-15 NOTE — Telephone Encounter (Signed)
Called and spoke to  patient. She stated sh can take the medications with no issue. I have sent in the medications to the pharmacy of her choice.

## 2021-12-06 NOTE — Telephone Encounter (Signed)
I called the patient to see when she is available to sign the allergy injection discontinuation form. I left a message for the patient to call the [place] office to go over availability.

## 2021-12-08 ENCOUNTER — Other Ambulatory Visit: Payer: Self-pay | Admitting: Chiropractic Medicine

## 2021-12-08 ENCOUNTER — Ambulatory Visit
Admission: RE | Admit: 2021-12-08 | Discharge: 2021-12-08 | Disposition: A | Discharge status: Home / Self Care | Payer: No Typology Code available for payment source | Source: Ambulatory Visit | Attending: Chiropractic Medicine | Admitting: Chiropractic Medicine

## 2021-12-08 DIAGNOSIS — R52 Pain, unspecified: Secondary | ICD-10-CM
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
## 2022-01-12 ENCOUNTER — Encounter: Payer: Self-pay | Admitting: Internal Medicine

## 2022-01-12 ENCOUNTER — Ambulatory Visit (INDEPENDENT_AMBULATORY_CARE_PROVIDER_SITE_OTHER): Payer: No Typology Code available for payment source | Admitting: Internal Medicine

## 2022-01-12 VITALS — BP 112/78 | HR 105 | Ht 61.0 in | Wt 163.2 lb

## 2022-01-12 DIAGNOSIS — E119 Type 2 diabetes mellitus without complications: Secondary | ICD-10-CM | POA: Diagnosis not present

## 2022-01-12 DIAGNOSIS — E269 Hyperaldosteronism, unspecified: Secondary | ICD-10-CM | POA: Diagnosis not present

## 2022-01-12 DIAGNOSIS — E890 Postprocedural hypothyroidism: Secondary | ICD-10-CM

## 2022-01-12 DIAGNOSIS — C73 Malignant neoplasm of thyroid gland: Secondary | ICD-10-CM

## 2022-01-12 LAB — BASIC METABOLIC PANEL
BUN: 17 mg/dL (ref 6–23)
CO2: 26 mEq/L (ref 19–32)
Calcium: 9.3 mg/dL (ref 8.4–10.5)
Chloride: 101 mEq/L (ref 96–112)
Creatinine, Ser: 0.86 mg/dL (ref 0.40–1.20)
GFR: 76.84 mL/min (ref >60.00)
Glucose, Bld: 107 mg/dL — ABNORMAL HIGH (ref 70–99)
Potassium: 4 mEq/L (ref 3.5–5.1)
Sodium: 138 mEq/L (ref 135–145)

## 2022-01-12 LAB — TSH: TSH: 0.22 u[IU]/mL — ABNORMAL LOW (ref 0.35–5.50)

## 2022-01-12 MED ORDER — EMPAGLIFLOZIN 10 MG PO TABS: 10.0000 mg | ORAL_TABLET | Freq: Every day | ORAL | 1 refills | Status: DC

## 2022-01-12 NOTE — Patient Instructions (Signed)
Start Jardiance/Farxiga 1 tablet every morning AFTER you completely heal from the bladder surgery

## 2022-01-15 LAB — THYROGLOBULIN LEVEL: Thyroglobulin: 0.4 ng/mL — ABNORMAL LOW

## 2022-01-15 LAB — THYROGLOBULIN ANTIBODY: Thyroglobulin Ab: <1 IU/mL (ref <=1)

## 2022-01-17 ENCOUNTER — Encounter: Payer: Self-pay | Admitting: Internal Medicine

## 2022-01-18 ENCOUNTER — Encounter: Payer: Self-pay | Admitting: Internal Medicine

## 2022-03-02 ENCOUNTER — Telehealth: Payer: Self-pay

## 2022-03-02 NOTE — Telephone Encounter (Signed)
Roderic Ovens from nuclear medicine radiology needs the thyroidectomy pathology report from the doctor that perform the procedure.  CB [phone]

## 2022-03-02 NOTE — Telephone Encounter (Signed)
Left detailed vm °

## 2022-03-05 NOTE — Written Directive (Deleted)
MOLECULAR IMAGING AND THERAPEUTICS WRITTEN DIRECTIVE   PATIENT NAME: Maureen Smith  PT DOB:   [birth year]                                              MRN: [id number]  ---------------------------------------------------------------------------------------------------------------------   I-131 THYROID CANCER THERAPY   RADIOPHARMACEUTICAL:  Iodine-131 Capsule    PRESCRIBED DOSE FOR ADMINISTRATION:    ROUTE OFADMINISTRATION:  PO   DIAGNOSIS: Papillary thyroid carcinoma    REFERRING PHYSICIAN: Dr. Kelton Pillar  THYROGEN STIMULATION OR HORMONE WITHDRAW: Thyrogen Stimulation   REMNANT ABLATION OR ADJUVANT THERAPY: Remnant Ablation   DATE OF THYROIDECTOMY: 03/16/2019   SURGEON: Dr. Aaron Mose    TSH:   Lab Results  Component Value Date   TSH 0.22 (L) 01/12/2022   TSH 0.03 (L) 09/01/2021   TSH 0.03 (L) 04/26/2021     PRIOR I-131 THERAPY (Date and Dose):   Pathology:  Cell type: '[]'$   Papillary  '[]'$   Follicular  '[]'$   [place]   Largest tumor focus:      cm  Extrathyroidal Extension?     Yes '[]'$   No '[]'$     Lymphovascular Invasion?  Yes  '[]'$   No  '[]'$     Margins positive ? Yes '[]'$   No '[]'$     Lymph nodes positive? Yes '[]'$   No  '[]'$       # positive nodes:   # negative nodes:     TNM staging: pT:         PN:         Mx:    ADDITIONAL PHYSICIAN COMMENTS/NOTES   AUTHORIZED USER SIGNATURE & TIME STAMP:

## 2022-03-05 NOTE — Written Directive (Addendum)
MOLECULAR IMAGING AND THERAPEUTICS WRITTEN DIRECTIVE   PATIENT NAME: Maureen Smith  PT DOB:   1966-03-27                                              MRN: [id number]  ---------------------------------------------------------------------------------------------------------------------  I-131 WHOLE BODY SCAN    RADIOPHARMACEUTICAL: Iodine-131 Capsule for Diagnostic Imaging   PRESCRIBED DOSE FOR ADMINISTRATION: 4.0 mCi   ROUTE OFADMINISTRATION: PO   DIAGNOSIS: Papillary thyroid carcinoma    REFERRING PHYSICIAN: DR. SHAMLEFFER   THYROGEN STIMULATION OR HORMONE WITHDRAW: Thyrogen Stimulation   DATE OF THYROIDECTOMY: 03/16/2019   SURGEON: Dr. Cam Hai [facility]   TSH:   Lab Results  Component Value Date   TSH 0.22 (L) 01/12/2022   TSH 0.03 (L) 09/01/2021   TSH 0.03 (L) 04/26/2021     PRIOR I-131 THERAPY (Date and Dose):   ADDITIONAL PHYSICIAN COMMENTS/NOTES   AUTHORIZED USER SIGNATURE & TIME STAMP: Rennis Golden, MD   03/05/22    2:45 PM

## 2022-03-07 ENCOUNTER — Ambulatory Visit: Payer: No Typology Code available for payment source | Admitting: Internal Medicine

## 2022-03-08 ENCOUNTER — Ambulatory Visit: Payer: No Typology Code available for payment source | Admitting: Allergy

## 2022-03-19 ENCOUNTER — Encounter (HOSPITAL_COMMUNITY)
Admission: RE | Admit: 2022-03-19 | Discharge: 2022-03-19 | Disposition: A | Discharge status: Home / Self Care | Payer: No Typology Code available for payment source | Source: Ambulatory Visit | Attending: Internal Medicine | Admitting: Internal Medicine

## 2022-03-19 DIAGNOSIS — C73 Malignant neoplasm of thyroid gland: Secondary | ICD-10-CM | POA: Insufficient documentation

## 2022-03-19 MED ORDER — THYROTROPIN ALFA 0.9 MG IM SOLR
INTRAMUSCULAR | Status: AC
  Administered 2022-03-19: 0.9 mg via INTRAMUSCULAR
  Filled 2022-03-19: qty 0.9

## 2022-03-19 MED ORDER — THYROTROPIN ALFA 0.9 MG IM SOLR: 0.9000 mg | INTRAMUSCULAR | Status: AC

## 2022-03-20 ENCOUNTER — Encounter (HOSPITAL_COMMUNITY)
Admission: RE | Admit: 2022-03-20 | Discharge: 2022-03-20 | Disposition: A | Discharge status: Home / Self Care | Payer: No Typology Code available for payment source | Source: Ambulatory Visit | Attending: Internal Medicine | Admitting: Internal Medicine

## 2022-03-20 ENCOUNTER — Other Ambulatory Visit: Payer: Self-pay | Admitting: Internal Medicine

## 2022-03-20 ENCOUNTER — Telehealth: Payer: Self-pay

## 2022-03-20 DIAGNOSIS — C73 Malignant neoplasm of thyroid gland: Secondary | ICD-10-CM | POA: Insufficient documentation

## 2022-03-20 MED ORDER — ONDANSETRON HCL 4 MG PO TABS: 4.0000 mg | ORAL_TABLET | Freq: Three times a day (TID) | ORAL | 0 refills | Status: DC | PRN

## 2022-03-20 MED ORDER — THYROTROPIN ALFA 0.9 MG IM SOLR: 0.9000 mg | INTRAMUSCULAR | Status: AC

## 2022-03-20 MED ORDER — THYROTROPIN ALFA 0.9 MG IM SOLR
INTRAMUSCULAR | Status: AC
  Administered 2022-03-20: 0.9 mg via INTRAMUSCULAR
  Filled 2022-03-20: qty 0.9

## 2022-03-20 NOTE — Telephone Encounter (Signed)
Patient would like to know if something can be called in for nausea that she is experiencing from the thyrogen injections.

## 2022-03-20 NOTE — Telephone Encounter (Signed)
Patient informed and expressed understanding

## 2022-03-21 ENCOUNTER — Encounter (HOSPITAL_COMMUNITY)
Admission: RE | Admit: 2022-03-21 | Discharge: 2022-03-21 | Disposition: A | Discharge status: Home / Self Care | Payer: No Typology Code available for payment source | Source: Ambulatory Visit | Attending: Internal Medicine | Admitting: Internal Medicine

## 2022-03-21 DIAGNOSIS — C73 Malignant neoplasm of thyroid gland: Secondary | ICD-10-CM | POA: Diagnosis not present

## 2022-03-21 MED ORDER — SODIUM IODIDE I 131 CAPSULE
4.1000 | Freq: Once | INTRAVENOUS | Status: AC | PRN
  Administered 2022-03-21: 4.1 via ORAL

## 2022-03-23 ENCOUNTER — Encounter (HOSPITAL_COMMUNITY)
Admission: RE | Admit: 2022-03-23 | Discharge: 2022-03-23 | Disposition: A | Discharge status: Home / Self Care | Payer: No Typology Code available for payment source | Source: Ambulatory Visit | Attending: Internal Medicine | Admitting: Internal Medicine

## 2022-03-23 DIAGNOSIS — C73 Malignant neoplasm of thyroid gland: Secondary | ICD-10-CM | POA: Diagnosis not present

## 2022-03-26 ENCOUNTER — Other Ambulatory Visit: Payer: Self-pay | Admitting: Internal Medicine

## 2022-03-26 DIAGNOSIS — C73 Malignant neoplasm of thyroid gland: Secondary | ICD-10-CM

## 2022-03-27 ENCOUNTER — Encounter: Payer: Self-pay | Admitting: Internal Medicine

## 2022-04-20 ENCOUNTER — Encounter: Payer: Self-pay | Admitting: Allergy

## 2022-04-20 ENCOUNTER — Ambulatory Visit (HOSPITAL_COMMUNITY)
Admission: RE | Admit: 2022-04-20 | Discharge: 2022-04-20 | Disposition: A | Discharge status: Home / Self Care | Payer: No Typology Code available for payment source | Source: Ambulatory Visit | Attending: Allergy | Admitting: Allergy

## 2022-04-20 ENCOUNTER — Ambulatory Visit (INDEPENDENT_AMBULATORY_CARE_PROVIDER_SITE_OTHER): Payer: No Typology Code available for payment source | Admitting: Allergy

## 2022-04-20 VITALS — BP 110/82 | HR 98 | Temp 98.6°F | Resp 18

## 2022-04-20 DIAGNOSIS — J4540 Moderate persistent asthma, uncomplicated: Secondary | ICD-10-CM | POA: Diagnosis not present

## 2022-04-20 DIAGNOSIS — T781XXD Other adverse food reactions, not elsewhere classified, subsequent encounter: Secondary | ICD-10-CM

## 2022-04-20 DIAGNOSIS — H1013 Acute atopic conjunctivitis, bilateral: Secondary | ICD-10-CM | POA: Diagnosis not present

## 2022-04-20 DIAGNOSIS — T7800XA Anaphylactic reaction due to unspecified food, initial encounter: Secondary | ICD-10-CM

## 2022-04-20 DIAGNOSIS — J3089 Other allergic rhinitis: Secondary | ICD-10-CM | POA: Diagnosis not present

## 2022-04-20 DIAGNOSIS — J4541 Moderate persistent asthma with (acute) exacerbation: Secondary | ICD-10-CM | POA: Insufficient documentation

## 2022-04-20 MED ORDER — FLUTICASONE-SALMETEROL 250-50 MCG/ACT IN AEPB: 1.0000 | INHALATION_SPRAY | Freq: Two times a day (BID) | RESPIRATORY_TRACT | 5 refills | Status: AC

## 2022-04-20 MED ORDER — ALBUTEROL SULFATE HFA 108 (90 BASE) MCG/ACT IN AERS: 2.0000 | INHALATION_SPRAY | Freq: Four times a day (QID) | RESPIRATORY_TRACT | 1 refills | Status: AC | PRN

## 2022-04-20 MED ORDER — OLOPATADINE HCL 0.1 % OP SOLN: 1.0000 [drp] | Freq: Two times a day (BID) | OPHTHALMIC | 5 refills | Status: AC

## 2022-04-20 MED ORDER — TRIAMCINOLONE ACETONIDE 0.1 % EX CREA: 1.0000 | TOPICAL_CREAM | Freq: Two times a day (BID) | CUTANEOUS | 1 refills | Status: AC | PRN

## 2022-04-20 MED ORDER — TACROLIMUS 0.1 % EX OINT: TOPICAL_OINTMENT | CUTANEOUS | 1 refills | Status: AC

## 2022-04-20 MED ORDER — FEXOFENADINE HCL 180 MG PO TABS: 180.0000 mg | ORAL_TABLET | Freq: Two times a day (BID) | ORAL | 5 refills | Status: AC | PRN

## 2022-04-20 MED ORDER — PREDNISONE 20 MG PO TABS: 20.0000 mg | ORAL_TABLET | Freq: Two times a day (BID) | ORAL | 0 refills | Status: AC

## 2022-04-20 MED ORDER — FLUTICASONE PROPIONATE 50 MCG/ACT NA SUSP: 1.0000 | Freq: Every day | NASAL | 5 refills | Status: AC | PRN

## 2022-04-20 NOTE — Progress Notes (Signed)
Follow-up Note  RE: Maureen Smith MRN: [id number] DOB: 1966-03-04 Date of Office Visit: 04/20/2022   History of present illness: Maureen Smith is a 56 y.o. female presenting today for follow-up of allergic rhinitis, asthma, food allergy with oral allergy syndrome.  She was last seen in the office on 11/13/2021 by our nurse practitioner Quita Skye.  She states over the past 2 weeks she has not be been breathing well with shortness of breath and coughing.  Denies wheeze and chest tightness.  She states the albuterol use is about 2-3 times a day at this time.  She states the changes of the weather always flare her asthma.  She is worried something is going on in her away and would like to have a CXR.  She states she is prone to pneumonia with last 2 years ago she believe.  No fevers.  Denies nighttime awakening.    She states her allergy symptoms have been doing ok.  She still sometimes feels congested like the marble is in the nose.  There was a day recently where she did use afrin to help. Otherwise she will use allegra or claritin and flonase.   She states she did eat some homemade egg nog that is really good but noticed that her mucus was thicker.  She does normally try to avoid dairy products.   She has access to epinephrine device that she has not needed to use.    Review of systems: Review of Systems  Constitutional: Negative.   HENT:         See HPI  Eyes: Negative.   Respiratory:         See HPI  Cardiovascular: Negative.   Gastrointestinal: Negative.   Musculoskeletal: Negative.   Skin: Negative.   Allergic/Immunologic: Negative.   Neurological: Negative.      All other systems negative unless noted above in HPI  Past medical/social/surgical/family history have been reviewed and are unchanged unless specifically indicated below.  No changes  Medication List: Current Outpatient Medications  Medication Sig Dispense Refill   albuterol (VENTOLIN HFA) 108 (90 Base) MCG/ACT  inhaler Inhale 2 puffs into the lungs every 6 (six) hours as needed for wheezing or shortness of breath. 18 g 2   amLODipine (NORVASC) 5 MG tablet TAKE ONE TABLET BY MOUTH TWICE A DAY FOR BLOOD PRESSURE.     atorvastatin (LIPITOR) 40 MG tablet Take 1 tablet (40 mg total) by mouth daily. 90 tablet 3   betamethasone dipropionate (DIPROLENE) 0.05 % ointment 1 APPLICATION APPLY ON THE SKIN TWICE A DAY APPLY TO HANDS WITH FLARES. TAPER USE AS ABLE     bisacodyl (DULCOLAX) 10 MG suppository Place 10 mg rectally as needed for moderate constipation.     conjugated estrogens (PREMARIN) vaginal cream Place 1 Applicatorful vaginally every Monday, Wednesday, and Friday.      dicyclomine (BENTYL) 10 MG capsule Take 10 mg by mouth as needed for spasms.     docusate sodium (COLACE) 100 MG capsule Take 100 mg by mouth daily as needed for mild constipation.     empagliflozin (JARDIANCE) 10 MG TABS tablet Take 1 tablet (10 mg total) by mouth daily before breakfast. 90 tablet 1   EPINEPHrine (EPIPEN 2-PAK) 0.3 mg/0.3 mL IJ SOAJ injection Inject 0.3 mg into the muscle as needed for anaphylaxis. 2 each 1   eplerenone (INSPRA) 50 MG tablet Take 1 tablet (50 mg total) by mouth daily. 90 tablet 3   fexofenadine (ALLEGRA) 180 MG tablet Take  1 tablet (180 mg total) by mouth 2 (two) times daily as needed for allergies or rhinitis (Take two on allergy shot days). 60 tablet 5   fluocinolone (VANOS) 0.01 % cream Apply topically as needed.     fluticasone (FLONASE) 50 MCG/ACT nasal spray Place 1 spray into both nostrils daily as needed for allergies. 16 g 5   fluticasone-salmeterol (WIXELA INHUB) 250-50 MCG/ACT AEPB Inhale 1 puff into the lungs in the morning and at bedtime. 60 each 5   ketoconazole (NIZORAL) 2 % shampoo Apply 1 application topically as directed. 120 mL 0   ketotifen (ZADITOR) 0.025 % ophthalmic solution 1 drop 2 (two) times daily.     levothyroxine (SYNTHROID) 125 MCG tablet Take 1 tablet (125 mcg total) by  mouth daily before breakfast. 90 tablet 3   loratadine (CLARITIN) 10 MG tablet Take 1 tablet (10 mg total) by mouth daily. 30 tablet 5   Magnesium Oxide 420 MG TABS TAKE ONE TABLET BY MOUTH TWICE A DAY FOR MUSCLE SPASMS FOR MUSCLE CRAMPS     olopatadine (PATANOL) 0.1 % ophthalmic solution Place 1 drop into both eyes 2 (two) times daily. 5 mL 5   potassium chloride SA (KLOR-CON) 20 MEQ tablet Take 1 tablet (20 mEq total) by mouth daily. 90 tablet 1   predniSONE (DELTASONE) 20 MG tablet Take 1 tablet (20 mg total) by mouth 2 (two) times daily with a meal for 5 days. 10 tablet 0   tacrolimus (PROTOPIC) 0.1 % ointment 1 APPLICATION APPLY ON THE SKIN TWICE A DAY APPLY TO AREAS ON FACE/EYELIDS     triamcinolone cream (KENALOG) 0.1 % Apply 1 application topically 2 (two) times daily as needed (skin irritation).      Vitamin D, Ergocalciferol, (DRISDOL) 50000 UNITS CAPS capsule Take 50,000 Units by mouth every Wednesday.   0   meloxicam (MOBIC) 15 MG tablet Take 15 mg by mouth as needed.     No current facility-administered medications for this visit.     Known medication allergies: Allergies  Allergen Reactions   Amoxicillin-Pot Clavulanate Nausea And Vomiting and Other (See Comments)    Did it involve swelling of the face/tongue/throat, SOB, or low BP? No Did it involve sudden or severe rash/hives, skin peeling, or any reaction on the inside of your mouth or nose? No Did you need to seek medical attention at a hospital or doctor's office? Less than 10 years When did it last happen?       If all above answers are "NO", may proceed with cephalosporin use.  Did it involve swelling of the face/tongue/throat, SOB, or low BP? No Did it involve sudden or severe rash/hives, skin peeling, or any reaction on the inside of your mouth or nose? No Did you need to seek medical attention at a hospital or doctor's office? Less than 10 years When did it last happen?       If all above answers are "NO", may  proceed with cephalosporin use.    Spironolactone     Other reaction(s): Engorgement of breasts   Gold Sodium Thiosulfate Other (See Comments)    Positive patch test   Morphine Nausea And Vomiting     Physical examination: Blood pressure 110/82, pulse 98, temperature 98.6 F (37 C), temperature source Temporal, resp. rate 18, SpO2 98 %.  General: Alert, interactive, in no acute distress. HEENT: PERRLA, TMs pearly gray, turbinates minimally edematous without discharge, post-pharynx non erythematous. Neck: Supple without lymphadenopathy. Lungs: Mildly decreased breath sounds bilaterally  without wheezing, rhonchi or rales. {no increased work of breathing. CV: Normal S1, S2 without murmurs. Abdomen: Nondistended, nontender. Skin: Warm and dry, without lesions or rashes. Extremities:  No clubbing, cyanosis or edema. Neuro:   Grossly intact.  Diagnositics/Labs:  Spirometry: FEV1: 1.57L 80%, FVC: 1.94L 78%, ratio consistent with nonobstructive pattern  Assessment and plan:   Asthma with current flare  - have CXR done  - take prednisone '20mg'$  twice a day x 5 days  - continue Advair (Wixela) HFA  250/50 mcg 1  puff twice a day to help prevent cough and wheeze. Rinse mouth out afterwards  - have access to albuterol inhaler 2 puffs or albuterol nebulizer every 4-6 hours as needed for cough/wheeze/shortness of breath/chest tightness.  May use 15-20 minutes prior to activity.   Monitor frequency of use.     Asthma control goals:  Full participation in all desired activities (may need albuterol before activity) Albuterol use two time or less a week on average (not counting use with activity) Cough interfering with sleep two time or less a month Oral steroids no more than once a year No hospitalizations  Allergic rhinitis - take Allegra or Claritin daily as needed for runny nose/itching.     - continue avoidance measures for grasses, weeds, trees, molds, dust mite, cat  - continue nasal  saline spray/rinse as needed.  - continue Flonase (fluticasone) 1-2 sprays in each nostril once a day as needed for stuffy nose  Food allergy  - continue your current food avoidance   - have access to self-injectable epinephrine Epipen 0.'3mg'$  at all times  - follow emergency action plan in case of allergic reaction  Oral allergy syndrome - avoid fruits that cause itching in your mouth - The oral allergy syndrome (OAS) or pollen-food allergy syndrome (PFAS) is a relatively common form of food allergy, particularly in adults. It typically occurs in people who have pollen allergies when the immune system "sees" proteins on the food that look like proteins on the pollen. This results in the allergy antibody (IgE) binding to the food instead of the pollen. Patients typically report itching and/or mild swelling of the mouth and throat immediately following ingestion of certain uncooked fruits (including nuts) or raw vegetables. Only a very small number of affected individuals experience systemic allergic reactions, such as anaphylaxis which occurs with true food allergies.    Follow-up in 3-4 months or sooner if needed  I appreciate the opportunity to take part in [place]'s care. Please do not hesitate to contact me with questions.  Sincerely,   Prudy Feeler, MD Allergy/Immunology [facility]

## 2022-04-20 NOTE — Patient Instructions (Addendum)
Asthma with current flare  - have CXR done  - take prednisone '20mg'$  twice a day x 5 days  - continue Advair (Wixela) HFA  250/50 mcg 1  puff twice a day to help prevent cough and wheeze. Rinse mouth out afterwards  - have access to albuterol inhaler 2 puffs or albuterol nebulizer every 4-6 hours as needed for cough/wheeze/shortness of breath/chest tightness.  May use 15-20 minutes prior to activity.   Monitor frequency of use.     Asthma control goals:  Full participation in all desired activities (may need albuterol before activity) Albuterol use two time or less a week on average (not counting use with activity) Cough interfering with sleep two time or less a month Oral steroids no more than once a year No hospitalizations  Allergic rhinitis - take Allegra or Claritin daily as needed for runny nose/itching.     - continue avoidance measures for grasses, weeds, trees, molds, dust mite, cat  - continue nasal saline spray/rinse as needed.  - continue Flonase (fluticasone) 1-2 sprays in each nostril once a day as needed for stuffy nose  Food allergy  - continue your current food avoidance   - have access to self-injectable epinephrine Epipen 0.'3mg'$  at all times  - follow emergency action plan in case of allergic reaction  Oral allergy syndrome - avoid fruits that cause itching in your mouth - The oral allergy syndrome (OAS) or pollen-food allergy syndrome (PFAS) is a relatively common form of food allergy, particularly in adults. It typically occurs in people who have pollen allergies when the immune system "sees" proteins on the food that look like proteins on the pollen. This results in the allergy antibody (IgE) binding to the food instead of the pollen. Patients typically report itching and/or mild swelling of the mouth and throat immediately following ingestion of certain uncooked fruits (including nuts) or raw vegetables. Only a very small number of affected individuals experience  systemic allergic reactions, such as anaphylaxis which occurs with true food allergies.    Follow-up in 3-4 months or sooner if needed

## 2022-04-23 ENCOUNTER — Other Ambulatory Visit: Payer: Self-pay

## 2022-04-23 ENCOUNTER — Encounter: Payer: Self-pay | Admitting: Family

## 2022-04-23 ENCOUNTER — Telehealth: Payer: Self-pay

## 2022-04-23 ENCOUNTER — Ambulatory Visit (INDEPENDENT_AMBULATORY_CARE_PROVIDER_SITE_OTHER): Payer: No Typology Code available for payment source | Admitting: Family

## 2022-04-23 ENCOUNTER — Ambulatory Visit
Admission: RE | Admit: 2022-04-23 | Discharge: 2022-04-23 | Disposition: A | Discharge status: Home / Self Care | Payer: No Typology Code available for payment source | Source: Ambulatory Visit | Attending: Internal Medicine | Admitting: Internal Medicine

## 2022-04-23 DIAGNOSIS — T781XXD Other adverse food reactions, not elsewhere classified, subsequent encounter: Secondary | ICD-10-CM

## 2022-04-23 DIAGNOSIS — J4540 Moderate persistent asthma, uncomplicated: Secondary | ICD-10-CM

## 2022-04-23 DIAGNOSIS — J4541 Moderate persistent asthma with (acute) exacerbation: Secondary | ICD-10-CM

## 2022-04-23 DIAGNOSIS — J3089 Other allergic rhinitis: Secondary | ICD-10-CM | POA: Diagnosis not present

## 2022-04-23 DIAGNOSIS — T7800XA Anaphylactic reaction due to unspecified food, initial encounter: Secondary | ICD-10-CM

## 2022-04-23 DIAGNOSIS — C73 Malignant neoplasm of thyroid gland: Secondary | ICD-10-CM

## 2022-04-23 MED ORDER — PREDNISONE 10 MG PO TABS: ORAL_TABLET | ORAL | 0 refills | Status: DC

## 2022-04-23 NOTE — Telephone Encounter (Signed)
Patient called in - DOB/Pharmacy verified - stated chest xray results are in. Patient stated the following symptoms: Cough - thick clear, brownish/ light green; nasal - clear; shortness of breath, sore throat; wheezing x last night. LOV: 04/20/22  Patient is requesting an antibiotic be sent in to [facility].  Patient advised message would be forwarded to a provider -  she will be contacted once a provider responds.  Patient verbalized understand, no further questions.

## 2022-04-23 NOTE — Progress Notes (Signed)
RE: Maureen Smith MRN: [id number] DOB: February 12, 1966 Date of Telemedicine Visit: 04/23/2022  Referring provider: Maury Dus, MD Primary care provider: Maury Dus, MD  Chief Complaint: Follow-up (Wheezing last night, thick mucous with light yellow color, fever 101.4 from checking temp under her arm)   Telemedicine Follow Up Visit via Telephone: I connected with Maureen Smith for a follow up on 04/23/22 by telephone and verified that I am speaking with the correct person using two identifiers.   I discussed the limitations, risks, security and privacy concerns of performing an evaluation and management service by telephone and the availability of in person appointments. I also discussed with the patient that there may be a patient responsible charge related to this service. The patient expressed understanding and agreed to proceed.  Patient is at home   Provider is at the office.  Visit start time: 12:11 PM Visit end time: 12:24 PM Insurance consent/check in by: Maureen Smith Medical consent and medical assistant/nurse: Maureen Smith  History of Present Illness: She is a 56 y.o. female, who is being followed for asthma, allergic rhinitis, food allergy, and oral allergy syndrome. Her previous allergy office visit was on the April 20, 2022 with Dr. Nelva Bush.   Asthma: She reports productive cough with light yellow sputum, wheezing that started last night and was really bad, tightness in her chest last night, and shortness of breath.  She cannot tell that the prednisone 20 mg twice a day for 5 days that she started on Friday has helped.She has 5 pills left.  She continues to take Wixela 250/5o mcg 1 puff twice a day and has been using her albuterol 2-3 times a week lately.  She does mention that when she uses the albuterol it does help.  Reviewed her chest x-ray from April 20, 2022 showing: "No evidence of acute cardiopulmonary disease.".  She also has a fever of 101.4 axillary and body aches.  She has not  checked for COVID-19 or influenza.  She has been around her grandkids who have been sick.  Allergic rhinitis:  She continues to alternate between Claritin and Allegra.  She does not have Flonase nasal spray yet because she has not gotten this yet from the [place].  She reports clear rhinorrhea that started last night and now today is brownish-yellow in color.  She also has nasal congestion, postnasal drip, and sinus pressure.  She has been taking Mucinex also.  Assessment and Plan: [name] is a 56 y.o. female with: Patient Instructions  Asthma with current flare  -  CXR on 04/20/22 showed:"No evidence of acute cardiopulmonary disease." - Recommend checking for Covid-19 and letting us know if your test is positive. Also, check for influenza if Covid -19 test negative.  - Continue prednisone '20mg'$  twice a day x 5 days, but add on 10 mg prednisone-1 tablet  twice a day for the next 3 doses (total of 60 mg daily), then take 2 tablets twice a day for 2 days, then 1 tablet twice a day for 2 days and then one tablet once a day for 1 day and then stop  - continue Advair (Wixela) HFA  250/50 mcg 1  puff twice a day to help prevent cough and wheeze. Rinse mouth out afterwards  - have access to albuterol inhaler 2 puffs or albuterol nebulizer every 4-6 hours as needed for cough/wheeze/shortness of breath/chest tightness.  May use 15-20 minutes prior to activity.   Monitor frequency of use.   -If your symptoms do not get better  I strongly recommend you go to the emergency room   Asthma control goals:  Full participation in all desired activities (may need albuterol before activity) Albuterol use two time or less a week on average (not counting use with activity) Cough interfering with sleep two time or less a month Oral steroids no more than once a year No hospitalizations  Allergic rhinitis - take Allegra or Claritin daily as needed for runny nose/itching.     - continue avoidance measures for grasses, weeds,  trees, molds, dust mite, cat  - continue nasal saline spray/rinse as needed.  - Re-start Flonase (fluticasone) 1-2 sprays in each nostril once a day as needed for stuffy nose - Discussed holding off on an antibiotic now due to a normal chest x-ray and her rhinorrhea just being yellow yesterday  Food allergy  - continue your current food avoidance   - have access to self-injectable epinephrine Epipen 0.'3mg'$  at all times  - follow emergency action plan in case of allergic reaction  Oral allergy syndrome - avoid fruits that cause itching in your mouth - The oral allergy syndrome (OAS) or pollen-food allergy syndrome (PFAS) is a relatively common form of food allergy, particularly in adults. It typically occurs in people who have pollen allergies when the immune system "sees" proteins on the food that look like proteins on the pollen. This results in the allergy antibody (IgE) binding to the food instead of the pollen. Patients typically report itching and/or mild swelling of the mouth and throat immediately following ingestion of certain uncooked fruits (including nuts) or raw vegetables. Only a very small number of affected individuals experience systemic allergic reactions, such as anaphylaxis which occurs with true food allergies.    Already scheduled follow-up appointment on July 25, 2022 with Dr. Nelva Bush at 3 PM or sooner if needed  Return in about 3 months (around 07/25/2022), or if symptoms worsen or fail to improve.  Meds ordered this encounter  Medications   predniSONE (DELTASONE) 10 MG tablet    Sig: Add 1 tablet twice a day to the already 20 mg twice a day you are taking for the next 3 doses, then take 2 tablets twice a day for 2 days, then 1 tablet twice a day for 2 days, then 1 tablet once a day for a day and stop.    Dispense:  16 tablet    Refill:  0   Lab Orders  No laboratory test(s) ordered today    Diagnostics: None.  Medication List:  Current Outpatient Medications   Medication Sig Dispense Refill   albuterol (VENTOLIN HFA) 108 (90 Base) MCG/ACT inhaler Inhale 2 puffs into the lungs every 6 (six) hours as needed for wheezing or shortness of breath. 18 g 1   amLODipine (NORVASC) 5 MG tablet TAKE ONE TABLET BY MOUTH TWICE A DAY FOR BLOOD PRESSURE.     atorvastatin (LIPITOR) 40 MG tablet Take 1 tablet (40 mg total) by mouth daily. 90 tablet 3   betamethasone dipropionate (DIPROLENE) 0.05 % ointment 1 APPLICATION APPLY ON THE SKIN TWICE A DAY APPLY TO HANDS WITH FLARES. TAPER USE AS ABLE     bisacodyl (DULCOLAX) 10 MG suppository Place 10 mg rectally as needed for moderate constipation.     conjugated estrogens (PREMARIN) vaginal cream Place 1 Applicatorful vaginally every Monday, Wednesday, and Friday.      dicyclomine (BENTYL) 10 MG capsule Take 10 mg by mouth as needed for spasms.     docusate sodium (COLACE) 100  MG capsule Take 100 mg by mouth daily as needed for mild constipation.     empagliflozin (JARDIANCE) 10 MG TABS tablet Take 1 tablet (10 mg total) by mouth daily before breakfast. 90 tablet 1   EPINEPHrine (EPIPEN 2-PAK) 0.3 mg/0.3 mL IJ SOAJ injection Inject 0.3 mg into the muscle as needed for anaphylaxis. 2 each 1   eplerenone (INSPRA) 50 MG tablet Take 1 tablet (50 mg total) by mouth daily. 90 tablet 3   fexofenadine (ALLEGRA) 180 MG tablet Take 1 tablet (180 mg total) by mouth 2 (two) times daily as needed for allergies or rhinitis (Take two on allergy shot days). 60 tablet 5   fluocinolone (VANOS) 0.01 % cream Apply topically as needed.     fluticasone (FLONASE) 50 MCG/ACT nasal spray Place 1 spray into both nostrils daily as needed for allergies. 16 g 5   fluticasone-salmeterol (WIXELA INHUB) 250-50 MCG/ACT AEPB Inhale 1 puff into the lungs in the morning and at bedtime. 60 each 5   ketoconazole (NIZORAL) 2 % shampoo Apply 1 application topically as directed. 120 mL 0   levothyroxine (SYNTHROID) 125 MCG tablet Take 1 tablet (125 mcg total) by  mouth daily before breakfast. 90 tablet 3   loratadine (CLARITIN) 10 MG tablet Take 1 tablet (10 mg total) by mouth daily. 30 tablet 5   Magnesium Oxide 420 MG TABS TAKE ONE TABLET BY MOUTH TWICE A DAY FOR MUSCLE SPASMS FOR MUSCLE CRAMPS     meloxicam (MOBIC) 15 MG tablet Take 15 mg by mouth as needed.     olopatadine (PATANOL) 0.1 % ophthalmic solution Place 1 drop into both eyes 2 (two) times daily. 5 mL 5   potassium chloride SA (KLOR-CON) 20 MEQ tablet Take 1 tablet (20 mEq total) by mouth daily. 90 tablet 1   predniSONE (DELTASONE) 10 MG tablet Add 1 tablet twice a day to the already 20 mg twice a day you are taking for the next 3 doses, then take 2 tablets twice a day for 2 days, then 1 tablet twice a day for 2 days, then 1 tablet once a day for a day and stop. 16 tablet 0   predniSONE (DELTASONE) 20 MG tablet Take 1 tablet (20 mg total) by mouth 2 (two) times daily with a meal for 5 days. 10 tablet 0   tacrolimus (PROTOPIC) 0.1 % ointment 1 APPLICATION APPLY ON THE SKIN TWICE A DAY APPLY TO AREAS ON FACE/EYELIDS 100 g 1   triamcinolone cream (KENALOG) 0.1 % Apply 1 Application topically 2 (two) times daily as needed (skin irritation). 30 g 1   Vitamin D, Ergocalciferol, (DRISDOL) 50000 UNITS CAPS capsule Take 50,000 Units by mouth every Wednesday.   0   No current facility-administered medications for this visit.   Allergies: Allergies  Allergen Reactions   Amoxicillin-Pot Clavulanate Nausea And Vomiting and Other (See Comments)    Did it involve swelling of the face/tongue/throat, SOB, or low BP? No Did it involve sudden or severe rash/hives, skin peeling, or any reaction on the inside of your mouth or nose? No Did you need to seek medical attention at a hospital or doctor's office? Less than 10 years When did it last happen?       If all above answers are "NO", may proceed with cephalosporin use.  Did it involve swelling of the face/tongue/throat, SOB, or low BP? No Did it involve  sudden or severe rash/hives, skin peeling, or any reaction on the inside of your mouth  or nose? No Did you need to seek medical attention at a hospital or doctor's office? Less than 10 years When did it last happen?       If all above answers are "NO", may proceed with cephalosporin use.    Spironolactone     Other reaction(s): Engorgement of breasts   Gold Sodium Thiosulfate Other (See Comments)    Positive patch test   Morphine Nausea And Vomiting   I reviewed her past medical history, social history, family history, and environmental history and no significant changes have been reported from previous visit on April 20, 2022.  Review of Systems  Constitutional:  Positive for fever.       Reports a fever of 101.4 axillary today.  Also reports body aches  HENT:         Reports clear rhinorrhea last night and now brownish-yellow in color.  Also reports nasal congestion, postnasal drip, and sinus pressure  Eyes:        Denies itchy watery eyes  Respiratory:         Reports cough that started as dry and now is productive with light yellow sputum.  Reports wheezing that started last night and was really bad.  Also had tightness in her chest last night.  Reports shortness of breath also.   Cardiovascular:  Positive for palpitations.       Reports chest palpitations earlier.  Discussed contacting her primary care physician for occurs again.  Allergic/Immunologic: Positive for environmental allergies and food allergies.  Neurological:  Negative for headaches.   Objective: Physical Exam Not obtained as encounter was done via telephone.   Previous notes and tests were reviewed.  I discussed the assessment and treatment plan with the patient. The patient was provided an opportunity to ask questions and all were answered. The patient agreed with the plan and demonstrated an understanding of the instructions.   The patient was advised to call back or seek an in-person evaluation if the  symptoms worsen or if the condition fails to improve as anticipated.  I provided 13 minutes of non-face-to-face time during this encounter.  It was my pleasure to participate in [place] care today. Please feel free to contact me with any questions or concerns.   Sincerely,  Althea Charon, FNP

## 2022-04-23 NOTE — Patient Instructions (Addendum)
Asthma with current flare  -  CXR on 04/20/22 showed:"No evidence of acute cardiopulmonary disease." - Recommend checking for Covid-19 and letting us know if your test is positive. Also, check for influenza if Covid -19 test negative.  - Continue prednisone '20mg'$  twice a day x 5 days, but add on 10 mg prednisone-1 tablet  twice a day for the next 3 doses (total of 60 mg daily), then take 2 tablets twice a day for 2 days, then 1 tablet twice a day for 2 days and then one tablet once a day for 1 day and then stop  - continue Advair (Wixela) HFA  250/50 mcg 1  puff twice a day to help prevent cough and wheeze. Rinse mouth out afterwards  - have access to albuterol inhaler 2 puffs or albuterol nebulizer every 4-6 hours as needed for cough/wheeze/shortness of breath/chest tightness.  May use 15-20 minutes prior to activity.   Monitor frequency of use.   -If your symptoms do not get better I strongly recommend you go to the emergency room   Asthma control goals:  Full participation in all desired activities (may need albuterol before activity) Albuterol use two time or less a week on average (not counting use with activity) Cough interfering with sleep two time or less a month Oral steroids no more than once a year No hospitalizations  Allergic rhinitis - take Allegra or Claritin daily as needed for runny nose/itching.     - continue avoidance measures for grasses, weeds, trees, molds, dust mite, cat  - continue nasal saline spray/rinse as needed.  - Re-start Flonase (fluticasone) 1-2 sprays in each nostril once a day as needed for stuffy nose - Discussed holding off on an antibiotic now due to a normal chest x-ray and her rhinorrhea just being yellow yesterday  Food allergy  - continue your current food avoidance   - have access to self-injectable epinephrine Epipen 0.'3mg'$  at all times  - follow emergency action plan in case of allergic reaction  Oral allergy syndrome - avoid fruits that cause  itching in your mouth - The oral allergy syndrome (OAS) or pollen-food allergy syndrome (PFAS) is a relatively common form of food allergy, particularly in adults. It typically occurs in people who have pollen allergies when the immune system "sees" proteins on the food that look like proteins on the pollen. This results in the allergy antibody (IgE) binding to the food instead of the pollen. Patients typically report itching and/or mild swelling of the mouth and throat immediately following ingestion of certain uncooked fruits (including nuts) or raw vegetables. Only a very small number of affected individuals experience systemic allergic reactions, such as anaphylaxis which occurs with true food allergies.    Already scheduled follow-up appointment on July 25, 2022 with Dr. Nelva Bush at 3 PM or sooner if needed

## 2022-04-23 NOTE — Telephone Encounter (Signed)
I can do a televisit right now, other wise I have no other openings today.

## 2022-04-26 ENCOUNTER — Telehealth: Payer: Self-pay | Admitting: Internal Medicine

## 2022-04-26 NOTE — Telephone Encounter (Signed)
MEDICATION: Contour Next One Test Strips  PHARMACY:  [facility] on [address]  HAS THE PATIENT CONTACTED THEIR PHARMACY?  YES  IS THIS A 90 DAY SUPPLY :   IS PATIENT OUT OF MEDICATION: YES  IF NOT; HOW MUCH IS LEFT: None  LAST APPOINTMENT DATE: '@10'$ /31/2023  NEXT APPOINTMENT DATE:'@2'$ /26/2024  DO WE HAVE YOUR PERMISSION TO LEAVE A DETAILED MESSAGE?:  OTHER COMMENTS:    **Let patient know to contact pharmacy at the end of the day to make sure medication is ready. **  ** Please notify patient to allow 48-72 hours to process**  **Encourage patient to contact the pharmacy for refills or they can request refills through [facility]**

## 2022-04-26 NOTE — Telephone Encounter (Signed)
Patient is calling requesting an appointment for labs.  Patient states that she was seen at the [facility] earlier this week and she was told that since her labs were out of the normal range that she needed labs drawn by her endocrinologist.  Per patient her potassium level was 3.3 and her iron was 16.5.  (Patient has a copy of the labs that she will bring in) Patient states also that her blood sugar levels have been elevated- 225 was her reading this morning, but she could not remember approximate time she tested.  Patient states that her PCP prescribed a glucose monitor, but she does not remember the name of it and will call back with the name.  Patient states she needs test strips and that her PCP refused to prescribe them for her.  Again, she will call back with the requested information as well.

## 2022-04-27 MED ORDER — CONTOUR NEXT TEST VI STRP: ORAL_STRIP | 12 refills | Status: DC

## 2022-04-27 NOTE — Telephone Encounter (Signed)
done

## 2022-04-27 NOTE — Addendum Note (Signed)
Addended by: Jefferson Fuel on: 04/27/2022 08:30 AM   Modules accepted: Orders

## 2022-04-30 ENCOUNTER — Telehealth: Payer: Self-pay

## 2022-04-30 DIAGNOSIS — E269 Hyperaldosteronism, unspecified: Secondary | ICD-10-CM

## 2022-04-30 DIAGNOSIS — E890 Postprocedural hypothyroidism: Secondary | ICD-10-CM

## 2022-04-30 DIAGNOSIS — C73 Malignant neoplasm of thyroid gland: Secondary | ICD-10-CM

## 2022-04-30 NOTE — Telephone Encounter (Signed)
Patient left a vm returning your call.

## 2022-04-30 NOTE — Telephone Encounter (Signed)
Spoke to Ms. Cafarella on 04/30/2022 at 1500   Discussed abnormal thyroid bed ultrasound and whole-body scan with uptake at the left thyroid bed   I had spoken to Dr. Woodfin Ganja , her surgeon and he recommended proceeding with an FNA as he suspects this is probably a remnant ablation specially with stable but detectable thyroglobulin    Patient in agreement of this  She also request potassium and iron levels to be checked as she recently had them done at the [facility] and were abnormal   Abby Nena Jordan, MD  [facility]  [facility] [address] [place], [place] [address] Phone: [phone] FAX: [phone]

## 2022-05-01 ENCOUNTER — Telehealth: Payer: Self-pay

## 2022-05-01 ENCOUNTER — Other Ambulatory Visit: Payer: Self-pay

## 2022-05-01 ENCOUNTER — Other Ambulatory Visit (INDEPENDENT_AMBULATORY_CARE_PROVIDER_SITE_OTHER): Payer: No Typology Code available for payment source

## 2022-05-01 DIAGNOSIS — E269 Hyperaldosteronism, unspecified: Secondary | ICD-10-CM | POA: Diagnosis not present

## 2022-05-01 LAB — POTASSIUM: Potassium: 3.7 mEq/L (ref 3.5–5.1)

## 2022-05-01 LAB — IRON: Iron: 49 ug/dL (ref 42–145)

## 2022-05-01 NOTE — Telephone Encounter (Signed)
PA needed on Contour Next One test strips.

## 2022-05-01 NOTE — Telephone Encounter (Signed)
Patient came in today  - DOB verified - signed for spacer she requested. Provider is aware.

## 2022-05-01 NOTE — Progress Notes (Unsigned)
PA needed on Contour Next One test strips.

## 2022-05-02 ENCOUNTER — Other Ambulatory Visit (HOSPITAL_COMMUNITY): Payer: Self-pay

## 2022-05-02 NOTE — Telephone Encounter (Signed)
Message sent to patient to see which one she would like to change to.

## 2022-05-02 NOTE — Telephone Encounter (Signed)
  Insurance prefers Accu-check, Tourist information centre manager, Accu-check Guide, Aviva Plus, One touch ultra, or One touch Verio Prior auth requires medical necessity. No PA submitted at this time.

## 2022-05-08 ENCOUNTER — Other Ambulatory Visit: Payer: Self-pay

## 2022-05-08 MED ORDER — ONETOUCH DELICA LANCETS 30G MISC: 3 refills | Status: AC

## 2022-05-08 MED ORDER — ONETOUCH VERIO VI STRP: ORAL_STRIP | 12 refills | Status: DC

## 2022-05-08 MED ORDER — ONETOUCH VERIO W/DEVICE KIT: PACK | 0 refills | Status: DC

## 2022-06-05 ENCOUNTER — Ambulatory Visit
Admission: RE | Admit: 2022-06-05 | Discharge: 2022-06-05 | Disposition: A | Discharge status: Home / Self Care | Payer: No Typology Code available for payment source | Source: Ambulatory Visit | Attending: Internal Medicine | Admitting: Internal Medicine

## 2022-06-05 ENCOUNTER — Other Ambulatory Visit (HOSPITAL_COMMUNITY)
Admission: RE | Admit: 2022-06-05 | Discharge: 2022-06-05 | Disposition: A | Discharge status: Home / Self Care | Payer: No Typology Code available for payment source | Source: Ambulatory Visit | Attending: Internal Medicine | Admitting: Internal Medicine

## 2022-06-05 DIAGNOSIS — C73 Malignant neoplasm of thyroid gland: Secondary | ICD-10-CM

## 2022-06-06 ENCOUNTER — Telehealth: Payer: Self-pay | Admitting: Internal Medicine

## 2022-06-06 NOTE — Telephone Encounter (Signed)
Patient is aware of INR results. Verified current dose of 4 mg every Tue, Thu, Sat; 6 mg all other days. Verified patient findings, no complaints. Patient is aware to recheck in 1 week.   Charge Captured.

## 2022-06-06 NOTE — Telephone Encounter (Signed)
Patient is calling to let Dr. Kelton Pillar know that she went for her thyroid biopsy yesterday (06/05/2022) and she is waiting to hear about the results.

## 2022-06-08 LAB — CYTOLOGY - NON PAP

## 2022-06-11 ENCOUNTER — Telehealth: Payer: Self-pay | Admitting: Internal Medicine

## 2022-06-11 NOTE — Telephone Encounter (Signed)
Spoke to the patient 06/11/2022 at 12:00  Discussed insufficient cellularity of 0.9 cm left nodule.   I have recommended proceeding with radioactive iodine ablation since she has not had this postoperatively   Patient would like to do research and will get back with me on this    Cave, MD  [facility] Endocrin[facility]  [facility] [address] [place], [place] [address] Phone: [phone] FAX: [phone]

## 2022-06-14 ENCOUNTER — Other Ambulatory Visit (HOSPITAL_COMMUNITY): Payer: Self-pay

## 2022-06-21 ENCOUNTER — Telehealth: Payer: Self-pay

## 2022-06-21 DIAGNOSIS — C73 Malignant neoplasm of thyroid gland: Secondary | ICD-10-CM

## 2022-06-21 NOTE — Telephone Encounter (Signed)
Patient would like to move forward radioactive iodine ablation.

## 2022-06-22 NOTE — Telephone Encounter (Signed)
Orders entered

## 2022-06-29 ENCOUNTER — Encounter: Payer: Self-pay | Admitting: Internal Medicine

## 2022-06-29 ENCOUNTER — Ambulatory Visit (INDEPENDENT_AMBULATORY_CARE_PROVIDER_SITE_OTHER): Payer: No Typology Code available for payment source | Admitting: Internal Medicine

## 2022-06-29 VITALS — BP 120/72 | HR 96 | Ht 61.0 in | Wt 156.8 lb

## 2022-06-29 DIAGNOSIS — E269 Hyperaldosteronism, unspecified: Secondary | ICD-10-CM

## 2022-06-29 DIAGNOSIS — C73 Malignant neoplasm of thyroid gland: Secondary | ICD-10-CM

## 2022-06-29 DIAGNOSIS — E890 Postprocedural hypothyroidism: Secondary | ICD-10-CM

## 2022-06-29 DIAGNOSIS — E119 Type 2 diabetes mellitus without complications: Secondary | ICD-10-CM

## 2022-06-29 LAB — POCT GLYCOSYLATED HEMOGLOBIN (HGB A1C): Hemoglobin A1C: 6.4 % — AB (ref 4.0–5.6)

## 2022-06-29 LAB — BASIC METABOLIC PANEL WITH GFR
BUN: 13 mg/dL (ref 6–23)
CO2: 27 meq/L (ref 19–32)
Calcium: 8.6 mg/dL (ref 8.4–10.5)
Chloride: 103 meq/L (ref 96–112)
Creatinine, Ser: 0.74 mg/dL (ref 0.40–1.20)
GFR: 91.73 mL/min
Glucose, Bld: 87 mg/dL (ref 70–99)
Potassium: 3.6 meq/L (ref 3.5–5.1)
Sodium: 139 meq/L (ref 135–145)

## 2022-06-29 LAB — MICROALBUMIN / CREATININE URINE RATIO
Creatinine,U: 62 mg/dL
Microalb Creat Ratio: 1.1 mg/g (ref 0.0–30.0)
Microalb, Ur: <0.7 mg/dL (ref 0.0–1.9)

## 2022-06-29 LAB — TSH: TSH: 0.7 u[IU]/mL (ref 0.35–5.50)

## 2022-06-29 MED ORDER — ONETOUCH VERIO W/DEVICE KIT: 1.0000 | PACK | Freq: Every day | 0 refills | Status: AC

## 2022-06-29 MED ORDER — ONETOUCH VERIO VI STRP: 1.0000 | ORAL_STRIP | Freq: Every day | 3 refills | Status: AC

## 2022-06-29 NOTE — Written Directive (Cosign Needed)
  MOLECULAR IMAGING AND THERAPEUTICS WRITTEN DIRECTIVE   PATIENT NAME: Maureen Smith  PT DOB:   Jul 13, 1965                                              MRN: [id number]  ---------------------------------------------------------------------------------------------------------------------   I-131 THYROID CANCER THERAPY   RADIOPHARMACEUTICAL:  Iodine-131 Capsule    PRESCRIBED DOSE FOR ADMINISTRATION: 60 mCi   ROUTE OFADMINISTRATION:  PO   DIAGNOSIS: Papillary Thyroid Carcinoma   REFERRING PHYSICIAN: Dr. Kelton Pillar   THYROGEN STIMULATION OR HORMONE WITHDRAW: Thyrogen Stimulated   REMNANT ABLATION OR ADJUVANT THERAPY:  Remnant Ablation   DATE OF THYROIDECTOMY: 03/16/2019   SURGEON: Dr. Gardenia Phlegm   TSH:   Lab Results  Component Value Date   TSH 0.70 06/29/2022   TSH 0.22 (L) 01/12/2022   TSH 0.03 (L) 09/01/2021     PRIOR I-131 THERAPY (Date and Dose):   Pathology:  Cell type: '[x]'$   Papillary  '[]'$   Follicular  '[]'$   [place]   Largest tumor focus:    1.1  cm in left gland  Extrathyroidal Extension?     Yes '[]'$   No '[x]'$     Lymphovascular Invasion?  Yes  '[]'$   No  '[x]'$     Margins positive ? Yes '[]'$   No '[x]'$     Lymph nodes positive? Yes '[]'$   No  '[]'$       # positive nodes:  0 # negative nodes:  0   TNM staging: pT:  1b       PN:   x      Mx:    ADDITIONAL PHYSICIAN COMMENTS/NOTES. Remnant ablation... Thyroidectomy in 2020... non suppressed thyroglobulin.... soft tissue in left neck (indeterminate)  57 year old female.   AUTHORIZED USER SIGNATURE & TIME STAMP: Rennis Golden, MD   07/03/22    8:54 AM
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
## 2022-07-02 ENCOUNTER — Telehealth: Payer: Self-pay

## 2022-07-02 LAB — THYROGLOBULIN LEVEL: Thyroglobulin: 1.1 ng/mL — ABNORMAL LOW

## 2022-07-02 LAB — THYROGLOBULIN ANTIBODY: Thyroglobulin Ab: 2 IU/mL — ABNORMAL HIGH (ref <=1)

## 2022-07-02 NOTE — Telephone Encounter (Signed)
Patient calling for the creatinine urine lab results.

## 2022-07-16 ENCOUNTER — Ambulatory Visit: Payer: No Typology Code available for payment source | Admitting: Internal Medicine

## 2022-07-20 ENCOUNTER — Telehealth: Payer: Self-pay

## 2022-07-20 NOTE — Telephone Encounter (Signed)
Paperwork not going thought with number provider for [place].  [phone]. Please provided alternative number.

## 2022-07-23 ENCOUNTER — Encounter (HOSPITAL_COMMUNITY)
Admission: RE | Admit: 2022-07-23 | Discharge: 2022-07-23 | Disposition: A | Discharge status: Home / Self Care | Payer: No Typology Code available for payment source | Source: Ambulatory Visit | Attending: Internal Medicine | Admitting: Internal Medicine

## 2022-07-23 NOTE — Telephone Encounter (Signed)
Paperwork faxed °

## 2022-07-24 ENCOUNTER — Encounter (HOSPITAL_COMMUNITY): Payer: No Typology Code available for payment source

## 2022-07-25 ENCOUNTER — Encounter: Payer: Self-pay | Admitting: Allergy

## 2022-07-25 ENCOUNTER — Ambulatory Visit (INDEPENDENT_AMBULATORY_CARE_PROVIDER_SITE_OTHER): Payer: No Typology Code available for payment source | Admitting: Allergy

## 2022-07-25 ENCOUNTER — Encounter (HOSPITAL_COMMUNITY): Admission: RE | Admit: 2022-07-25 | Payer: No Typology Code available for payment source | Source: Ambulatory Visit

## 2022-07-25 ENCOUNTER — Other Ambulatory Visit: Payer: Self-pay

## 2022-07-25 VITALS — BP 100/84 | HR 88 | Temp 98.0°F | Resp 12 | Ht 61.0 in | Wt 155.2 lb

## 2022-07-25 DIAGNOSIS — T781XXD Other adverse food reactions, not elsewhere classified, subsequent encounter: Secondary | ICD-10-CM | POA: Diagnosis not present

## 2022-07-25 DIAGNOSIS — J4540 Moderate persistent asthma, uncomplicated: Secondary | ICD-10-CM

## 2022-07-25 DIAGNOSIS — J3089 Other allergic rhinitis: Secondary | ICD-10-CM

## 2022-07-25 DIAGNOSIS — T7800XA Anaphylactic reaction due to unspecified food, initial encounter: Secondary | ICD-10-CM

## 2022-07-25 MED ORDER — WIXELA INHUB 500-50 MCG/ACT IN AEPB: 1.0000 | INHALATION_SPRAY | Freq: Two times a day (BID) | RESPIRATORY_TRACT | 5 refills | Status: AC

## 2022-07-25 MED ORDER — EPINEPHRINE 0.3 MG/0.3ML IJ SOAJ: 0.3000 mg | INTRAMUSCULAR | 1 refills | Status: AC | PRN

## 2022-07-25 NOTE — Patient Instructions (Addendum)
Asthma   - control could be improved at this time  - increase to Advair (Wixela) HFA  500/50 mcg 1  puff twice a day to help prevent cough and wheeze. Rinse mouth out afterwards  - have access to albuterol inhaler 2 puffs or albuterol nebulizer every 4-6 hours as needed for cough/wheeze/shortness of breath/chest tightness.  May use 15-20 minutes prior to activity.   Monitor frequency of use.    Asthma control goals:  Full participation in all desired activities (may need albuterol before activity) Albuterol use two time or less a week on average (not counting use with activity) Cough interfering with sleep two time or less a month Oral steroids no more than once a year No hospitalizations  Allergic rhinitis -  take Allegra or Claritin daily as needed for runny nose/itching.     - continue avoidance measures for grasses, weeds, trees, molds, dust mite, cat  - continue nasal saline spray/rinse as needed.  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 It typically occurs in people who have pollen allergies when the immune system "sees" proteins on the food that look like proteins on the pollen. This results in the allergy antibody (IgE) binding to the food instead of the pollen. Patients typically report itching and/or mild swelling of the mouth and throat immediately following ingestion of certain uncooked fruits (including nuts) or raw vegetables. Only a very small number of affected individuals experience systemic allergic reactions, such as  anaphylaxis which occurs with true food allergies.    Follow-up in 4-6 months or sooner if needed

## 2022-07-25 NOTE — Progress Notes (Unsigned)
Follow-up Note  RE: Maureen Smith MRN: [id number] DOB: 11-05-1965 Date of Office Visit: 07/25/2022   History of present illness: Maureen Smith is a 57 y.o. female presenting today for follow-up of asthma, allergic rhinitis, food allergy with oral allergy syndrome.  She was last seen in the office on 04/23/22 by our nurse practitioner Quita Skye.   At her last visit she was treated with prednisone course for asthma flare.   She states the last 4 weeks has been good.  She states she did recover from an illness/flare she had in December and she recalls testing positive for the flu.  She did receive azithromycin, tamiflu and a course of prednisone.  She completed these medications and did get better.  With her asthma control she is doing Wixela 1 puff twice a day.  She states January was rough with her asthma and was needing to use albuterol and has needed to her her albuterol inhaler refilled in the 3 months due to using it up.  She is starting to note more nasal congestion now.  She has flonase and just got her 3 month supply.  She is alternating between allegra and claritin.  She continues to her current food avoidance and she has access to an epipen but states it might be expired.    Review of systems in the past 4 weeks: Review of Systems  Constitutional: Negative.   HENT:  Positive for congestion.   Eyes: Negative.   Respiratory: Negative.    Cardiovascular: Negative.   Gastrointestinal: Negative.   Musculoskeletal: Negative.   Skin: Negative.   Allergic/Immunologic: Negative.   Neurological: Negative.      All other systems negative unless noted above in HPI  Past medical/social/surgical/family history have been reviewed and are unchanged unless specifically indicated below.  No changes  Medication List: Current Outpatient Medications  Medication Sig Dispense Refill   albuterol (VENTOLIN HFA) 108 (90 Base) MCG/ACT inhaler Inhale 2 puffs into the lungs every 6 (six) hours as needed for  wheezing or shortness of breath. 18 g 1   amLODipine (NORVASC) 5 MG tablet TAKE ONE TABLET BY MOUTH TWICE A DAY FOR BLOOD PRESSURE.     atorvastatin (LIPITOR) 40 MG tablet Take 1 tablet (40 mg total) by mouth daily. 90 tablet 3   betamethasone dipropionate (DIPROLENE) 0.05 % ointment 1 APPLICATION APPLY ON THE SKIN TWICE A DAY APPLY TO HANDS WITH FLARES. TAPER USE AS ABLE     bisacodyl (DULCOLAX) 10 MG suppository Place 10 mg rectally as needed for moderate constipation.     conjugated estrogens (PREMARIN) vaginal cream Place 1 Applicatorful vaginally every Monday, Wednesday, and Friday.      dicyclomine (BENTYL) 10 MG capsule Take 10 mg by mouth as needed for spasms.     docusate sodium (COLACE) 100 MG capsule Take 100 mg by mouth daily as needed for mild constipation.     empagliflozin (JARDIANCE) 10 MG TABS tablet Take 1 tablet (10 mg total) by mouth daily before breakfast. 90 tablet 1   EPINEPHrine (EPIPEN 2-PAK) 0.3 mg/0.3 mL IJ SOAJ injection Inject 0.3 mg into the muscle as needed for anaphylaxis. 2 each 1   EPINEPHrine (EPIPEN 2-PAK) 0.3 mg/0.3 mL IJ SOAJ injection Inject 0.3 mg into the muscle as needed for anaphylaxis. 0.3 mL 1   eplerenone (INSPRA) 50 MG tablet Take 1 tablet (50 mg total) by mouth daily. 90 tablet 3   fexofenadine (ALLEGRA) 180 MG tablet Take 1 tablet (180 mg total) by  mouth 2 (two) times daily as needed for allergies or rhinitis (Take two on allergy shot days). 60 tablet 5   fluocinolone (VANOS) 0.01 % cream Apply topically as needed.     fluticasone (FLONASE) 50 MCG/ACT nasal spray Place 1 spray into both nostrils daily as needed for allergies. 16 g 5   fluticasone-salmeterol (WIXELA INHUB) 250-50 MCG/ACT AEPB Inhale 1 puff into the lungs in the morning and at bedtime. 60 each 5   glucose blood (ONETOUCH VERIO) test strip 1 each by Other route daily in the afternoon. Use as instructed 100 each 3   ketoconazole (NIZORAL) 2 % shampoo Apply 1 application topically as  directed. 120 mL 0   levothyroxine (SYNTHROID) 125 MCG tablet Take 1 tablet (125 mcg total) by mouth daily before breakfast. 90 tablet 3   loratadine (CLARITIN) 10 MG tablet Take 1 tablet (10 mg total) by mouth daily. 30 tablet 5   Magnesium Oxide 420 MG TABS TAKE ONE TABLET BY MOUTH TWICE A DAY FOR MUSCLE SPASMS FOR MUSCLE CRAMPS     meloxicam (MOBIC) 15 MG tablet Take 15 mg by mouth as needed.     olopatadine (PATANOL) 0.1 % ophthalmic solution Place 1 drop into both eyes 2 (two) times daily. 5 mL 5   OneTouch Delica Lancets 99991111 MISC Use to check blood sugars 2 times daily 100 each 3   potassium chloride SA (KLOR-CON) 20 MEQ tablet Take 1 tablet (20 mEq total) by mouth daily. 90 tablet 1   tacrolimus (PROTOPIC) 0.1 % ointment 1 APPLICATION APPLY ON THE SKIN TWICE A DAY APPLY TO AREAS ON FACE/EYELIDS 100 g 1   triamcinolone cream (KENALOG) 0.1 % Apply 1 Application topically 2 (two) times daily as needed (skin irritation). 30 g 1   Vitamin D, Ergocalciferol, (DRISDOL) 50000 UNITS CAPS capsule Take 50,000 Units by mouth every Wednesday.   0   WIXELA INHUB 500-50 MCG/ACT AEPB Inhale 1 puff into the lungs in the morning and at bedtime. 60 each 5   Blood Glucose Monitoring Suppl (ONETOUCH VERIO) w/Device KIT 1 Device by Does not apply route daily in the afternoon. (Patient not taking: Reported on 07/25/2022) 1 kit 0   No current facility-administered medications for this visit.     Known medication allergies: Allergies  Allergen Reactions   Amoxicillin-Pot Clavulanate Nausea And Vomiting and Other (See Comments)    Did it involve swelling of the face/tongue/throat, SOB, or low BP? No Did it involve sudden or severe rash/hives, skin peeling, or any reaction on the inside of your mouth or nose? No Did you need to seek medical attention at a hospital or doctor's office? Less than 10 years When did it last happen?       If all above answers are "NO", may proceed with cephalosporin use.  Did it  involve swelling of the face/tongue/throat, SOB, or low BP? No Did it involve sudden or severe rash/hives, skin peeling, or any reaction on the inside of your mouth or nose? No Did you need to seek medical attention at a hospital or doctor's office? Less than 10 years When did it last happen?       If all above answers are "NO", may proceed with cephalosporin use.    Spironolactone     Other reaction(s): Engorgement of breasts   Gold Sodium Thiosulfate Other (See Comments)    Positive patch test   Morphine Nausea And Vomiting     Physical examination: Blood pressure 100/84, pulse 88, temperature  98 F (36.7 C), resp. rate 12, height '5\' 1"'$  (1.549 m), weight 155 lb 3.2 oz (70.4 kg), SpO2 97 %.  General: Alert, interactive, in no acute distress. HEENT: PERRLA, TMs pearly gray, turbinates moderately edematous without discharge, post-pharynx non erythematous. Neck: Supple without lymphadenopathy. Lungs: Clear to auscultation without wheezing, rhonchi or rales. {no increased work of breathing. CV: Normal S1, S2 without murmurs. Abdomen: Nondistended, nontender. Skin: Warm and dry, without lesions or rashes. Extremities:  No clubbing, cyanosis or edema. Neuro:   Grossly intact.  Diagnositics/Labs: None today  Assessment and plan:   Asthma   - control could be improved at this time  - increase to Advair (Wixela) HFA  500/50 mcg 1  puff twice a day to help prevent cough and wheeze. Rinse mouth out afterwards  - have access to albuterol inhaler 2 puffs or albuterol nebulizer every 4-6 hours as needed for cough/wheeze/shortness of breath/chest tightness.  May use 15-20 minutes prior to activity.   Monitor frequency of use.    Asthma control goals:  Full participation in all desired activities (may need albuterol before activity) Albuterol use two time or less a week on average (not counting use with activity) Cough interfering with sleep two time or less a month Oral steroids no more  than once a year No hospitalizations  Allergic rhinitis -  take Allegra or Claritin daily as needed for runny nose/itching.     - continue avoidance measures for grasses, weeds, trees, molds, dust mite, cat  - continue nasal saline spray/rinse as needed.  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 It typically occurs in people who have pollen allergies when the immune system "sees" proteins on the food that look like proteins on the pollen. This results in the allergy antibody (IgE) binding to the food instead of the pollen. Patients typically report itching and/or mild swelling of the mouth and throat immediately following ingestion of certain uncooked fruits (including nuts) or raw vegetables. Only a very small number of affected individuals experience systemic allergic reactions, such as anaphylaxis which occurs with true food allergies.    Follow-up in 4-6 months or sooner if needed  I appreciate the opportunity to take part in Maureen Smith's care. Please do not hesitate to contact me with questions.  Sincerely,   Prudy Feeler, MD Allergy/Immunology [facility]

## 2022-07-30 ENCOUNTER — Encounter (HOSPITAL_COMMUNITY): Payer: No Typology Code available for payment source

## 2022-08-01 ENCOUNTER — Encounter: Payer: Self-pay | Admitting: Internal Medicine

## 2022-08-02 ENCOUNTER — Other Ambulatory Visit (HOSPITAL_COMMUNITY): Payer: No Typology Code available for payment source

## 2022-08-03 ENCOUNTER — Other Ambulatory Visit (HOSPITAL_COMMUNITY): Payer: No Typology Code available for payment source

## 2022-08-31 IMAGING — US US THYROID
1 series · 14 of 18 positions shown · non-contrast
Comparison: 09/20/2020

CLINICAL DATA: Status post total thyroidectomy due to papillary
thyroid carcinoma

EXAM:
THYROID ULTRASOUND
TECHNIQUE: Ultrasound examination of the thyroid gland and adjacent soft
tissues was performed.

[Series 1: us thyroid · 0.06mm/px · 14 of 18 slices shown]
[im 1/18]
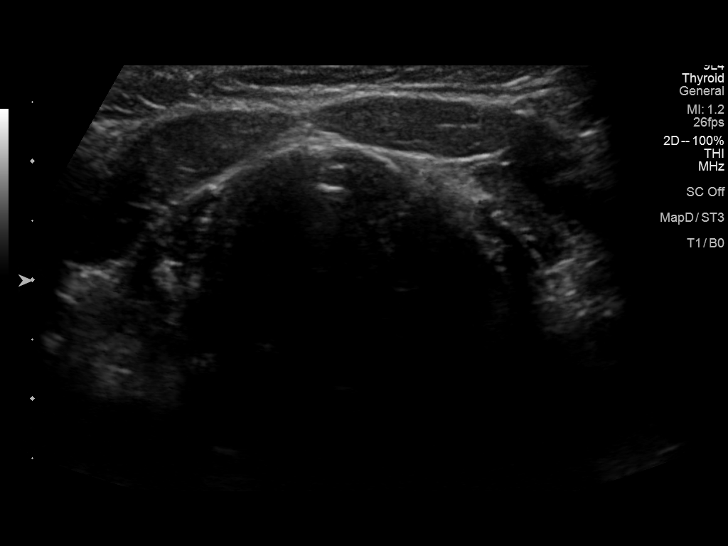
[im 2/18]
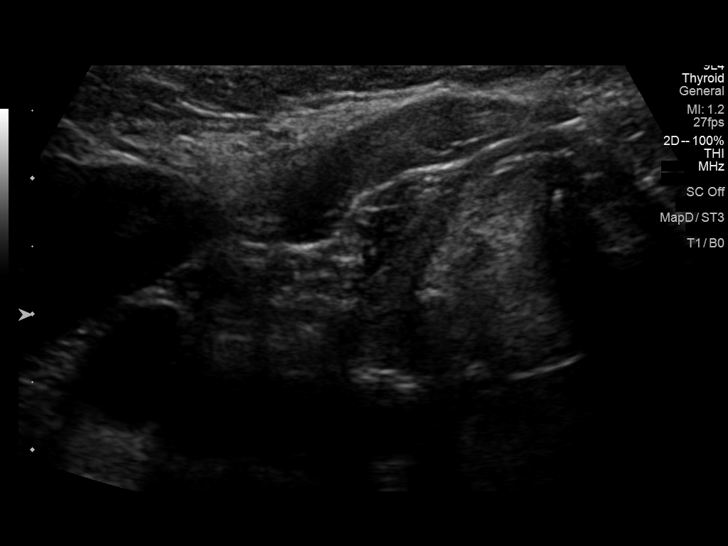
[im 4/18]
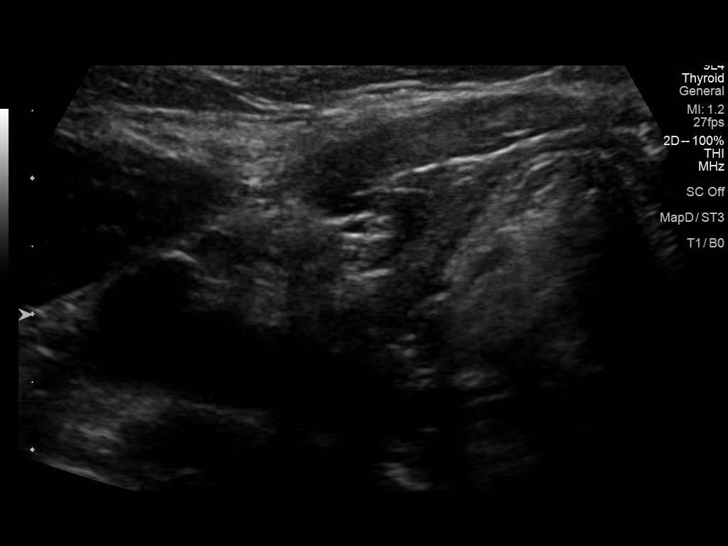
[im 5/18]
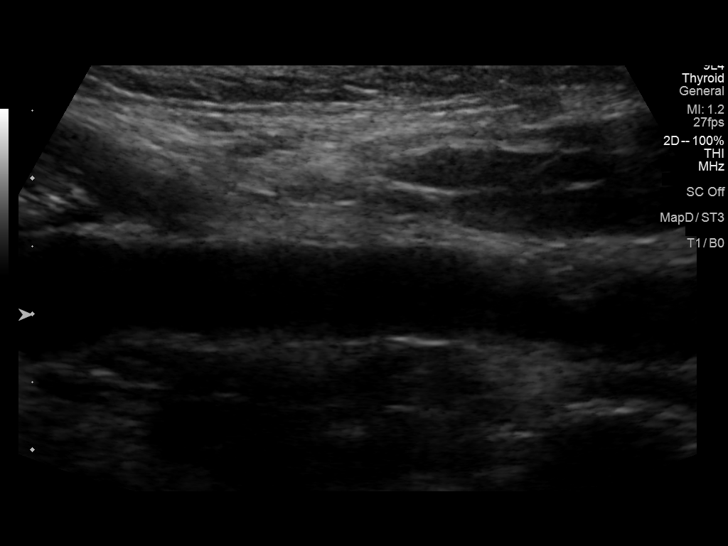
[im 6/18]
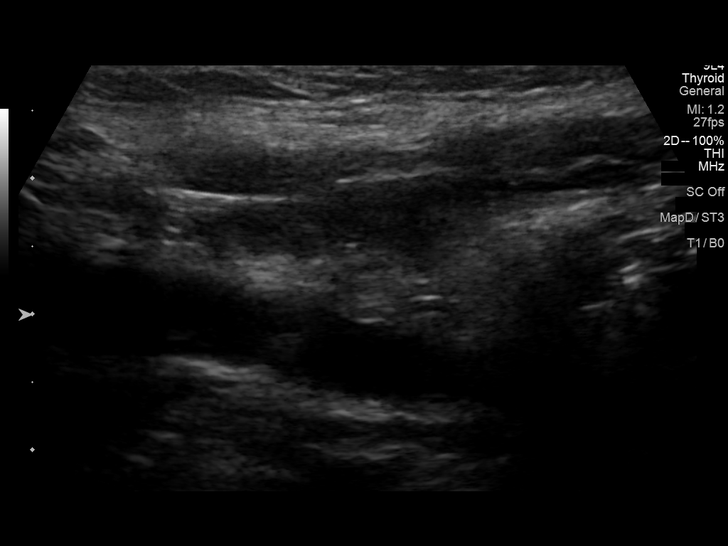
[im 8/18]
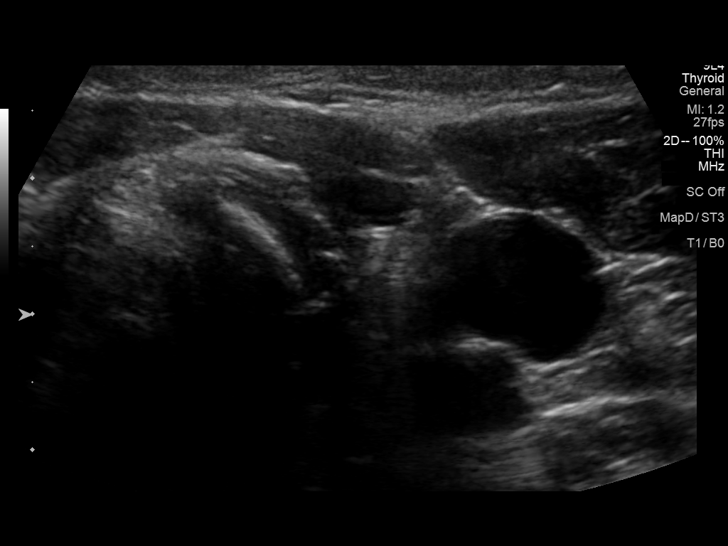
[im 9/18]
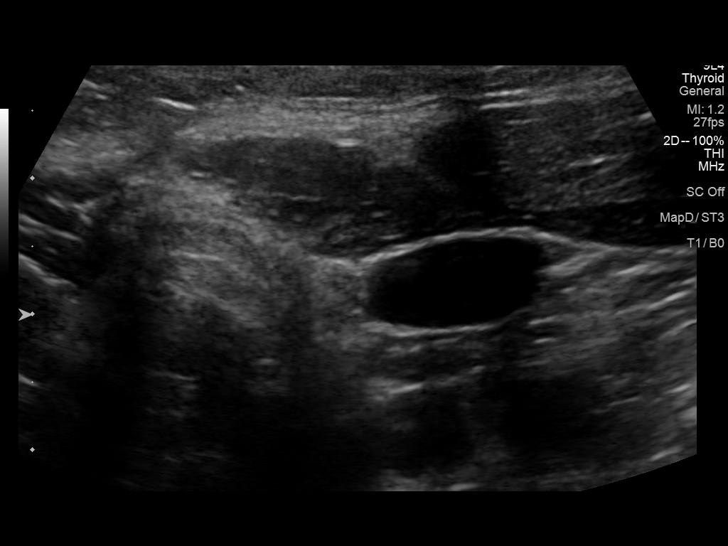
[im 10/18]
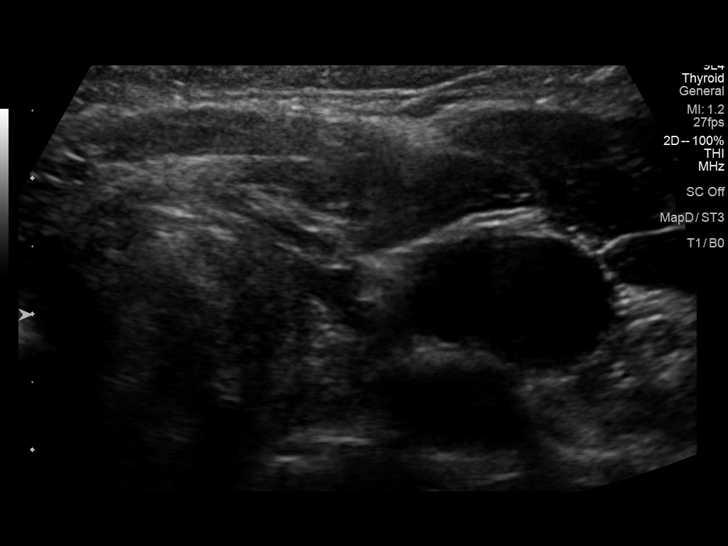
[im 11/18]
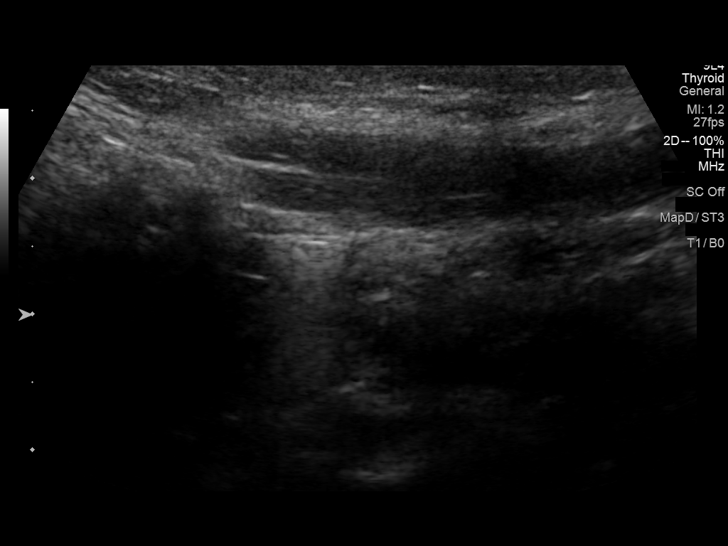
[im 13/18]
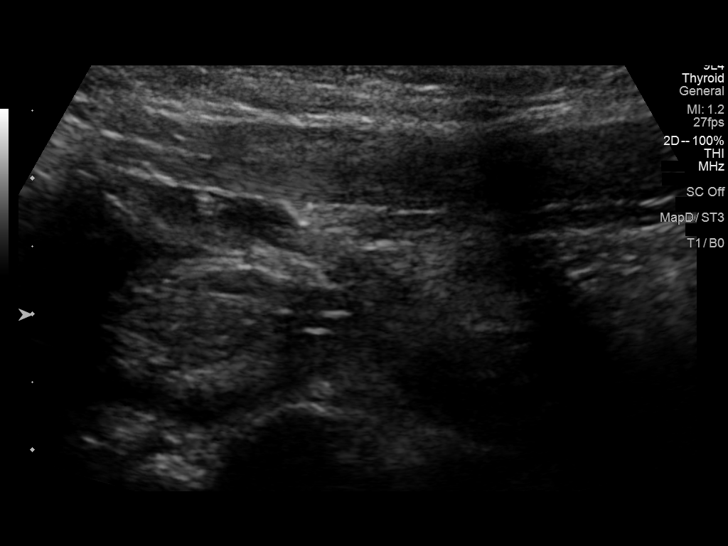
[im 14/18]
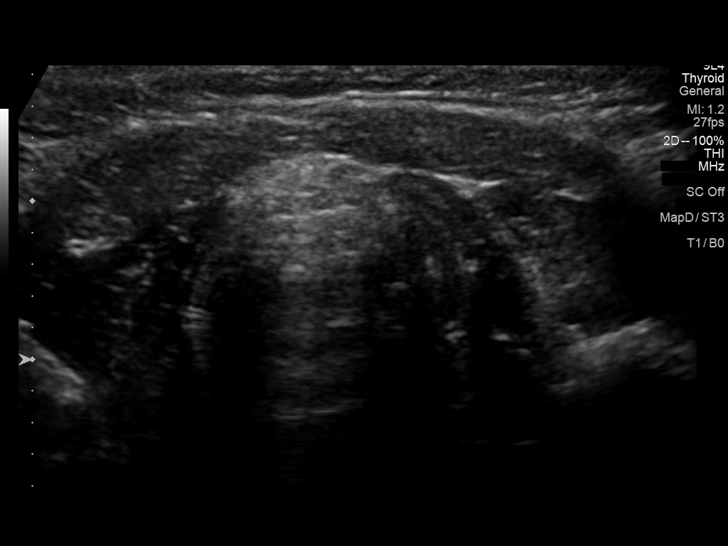
[im 15/18]
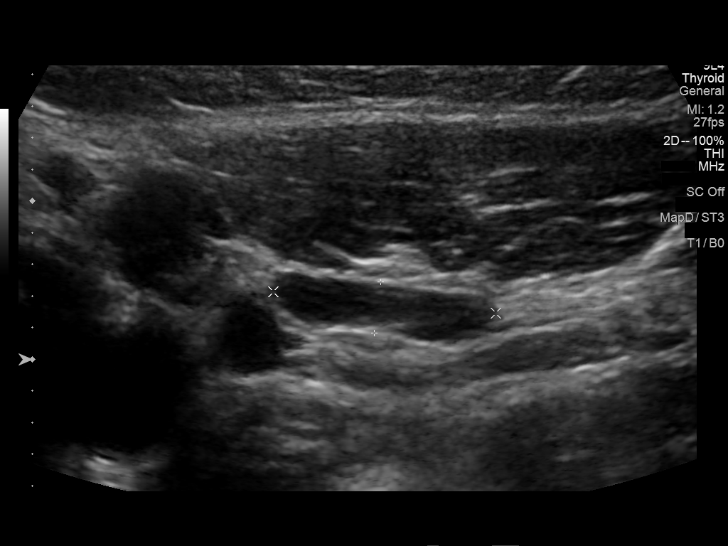
[im 17/18]
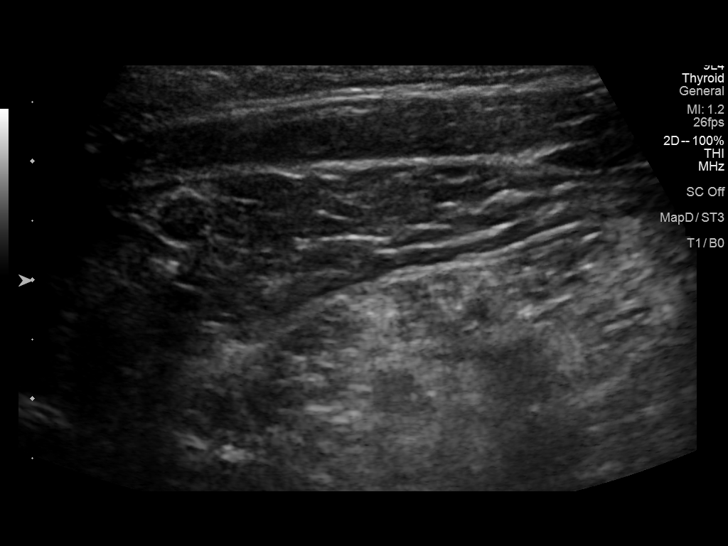
[im 18/18]
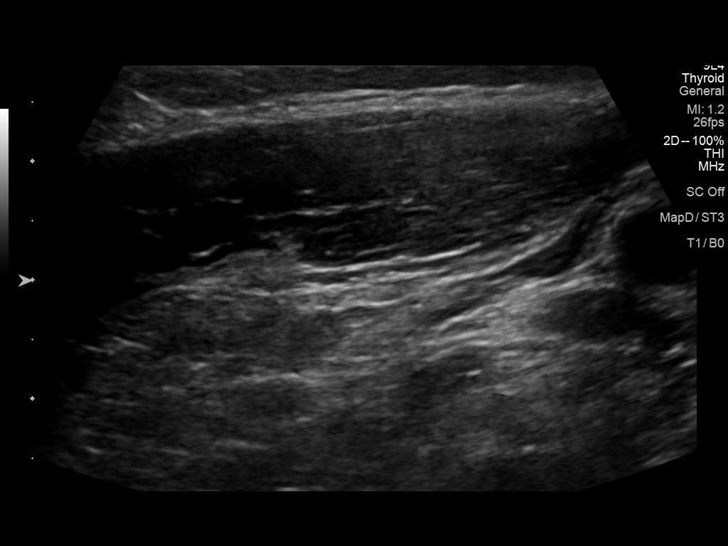

[14 of 18 positions shown; findings below may reference images not displayed]

FINDINGS: No residual thyroid tissue is identified in the thyroidectomy bed.

No enlarged lymph nodes seen in the imaged portions of the neck.
IMPRESSION: No evidence of recurrent or residual thyroid malignancy.

The above is in keeping with the ACR TI-RADS recommendations - [HOSPITAL] 4364;[DATE].

## 2022-08-31 NOTE — Written Directive (Cosign Needed)
MOLECULAR IMAGING AND THERAPEUTICS WRITTEN DIRECTIVE   PATIENT NAME: Maureen Smith  PT DOB:   1966-04-03                                              MRN: [id number]  ---------------------------------------------------------------------------------------------------------------------   I-131 THYROID CANCER THERAPY   RADIOPHARMACEUTICAL:  Iodine-131 Capsule    PRESCRIBED DOSE FOR ADMINISTRATION: 50 mCi    ROUTE OFADMINISTRATION:  PO   DIAGNOSIS: Papillary thyroid carcinoma    REFERRING PHYSICIAN: Dr. Lonzo Cloud    THYROGEN STIMULATION OR HORMONE WITHDRAW: Thyrogen stimulated   REMNANT ABLATION OR ADJUVANT THERAPY: remnant ablation   DATE OF THYROIDECTOMY: 03/16/2019   SURGEON: Dr. Gwyndolyn Kaufman   TSH:  0.70 as of 06/29/2022 Lab Results  Component Value Date   TSH 0.70 06/29/2022   TSH 0.22 (L) 01/12/2022   TSH 0.03 (L) 09/01/2021     PRIOR I-131 THERAPY (Date and Dose):   Pathology:  Cell type: [x]   Papillary  []   Follicular  []   [place]   Largest tumor focus: 1.01     cm  Extrathyroidal Extension?     Yes []   No [x]     Lymphovascular Invasion?  Yes  []   No  [x]     Margins positive ? Yes []   No [x]     Lymph nodes positive? Yes []   No  []       # positive nodes:  0 # negative nodes:  0  x TNM staging: pT:  1b       PN: x        Mx:    ADDITIONAL PHYSICIAN COMMENTS/NOTES Remant thyroid ablation .  57 year old female.  (Low risk disease thyroidectomy 02/2019)  Nodule in thryoid bed on recent US.  Non diagnostic biopsy. AUTHORIZED USER SIGNATURE & TIME STAMP: Patriciaann Clan, MD   09/04/22    10:09 AM

## 2022-09-17 ENCOUNTER — Encounter (HOSPITAL_COMMUNITY)
Admission: RE | Admit: 2022-09-17 | Discharge: 2022-09-17 | Disposition: A | Discharge status: Home / Self Care | Payer: No Typology Code available for payment source | Source: Ambulatory Visit | Attending: Internal Medicine | Admitting: Internal Medicine

## 2022-09-17 DIAGNOSIS — C73 Malignant neoplasm of thyroid gland: Secondary | ICD-10-CM | POA: Diagnosis present

## 2022-09-17 MED ORDER — THYROTROPIN ALFA 0.9 MG IM SOLR
INTRAMUSCULAR | Status: AC
  Filled 2022-09-17: qty 0.9

## 2022-09-17 MED ORDER — THYROTROPIN ALFA 0.9 MG IM SOLR
0.9000 mg | INTRAMUSCULAR | Status: AC
  Administered 2022-09-17: 0.9 mg via INTRAMUSCULAR

## 2022-09-18 ENCOUNTER — Encounter (HOSPITAL_COMMUNITY)
Admission: RE | Admit: 2022-09-18 | Discharge: 2022-09-18 | Disposition: A | Discharge status: Home / Self Care | Payer: No Typology Code available for payment source | Source: Ambulatory Visit | Attending: Internal Medicine | Admitting: Internal Medicine

## 2022-09-18 DIAGNOSIS — C73 Malignant neoplasm of thyroid gland: Secondary | ICD-10-CM | POA: Insufficient documentation

## 2022-09-18 MED ORDER — THYROTROPIN ALFA 0.9 MG IM SOLR
INTRAMUSCULAR | Status: AC
  Filled 2022-09-18: qty 0.9

## 2022-09-18 MED ORDER — THYROTROPIN ALFA 0.9 MG IM SOLR
0.9000 mg | INTRAMUSCULAR | Status: AC
  Administered 2022-09-18: 0.9 mg via INTRAMUSCULAR

## 2022-09-19 ENCOUNTER — Encounter (HOSPITAL_COMMUNITY)
Admission: RE | Admit: 2022-09-19 | Discharge: 2022-09-19 | Disposition: A | Discharge status: Home / Self Care | Payer: No Typology Code available for payment source | Source: Ambulatory Visit | Attending: Internal Medicine | Admitting: Internal Medicine

## 2022-09-25 ENCOUNTER — Telehealth: Payer: No Typology Code available for payment source

## 2022-09-25 ENCOUNTER — Encounter: Payer: Self-pay | Admitting: Internal Medicine

## 2022-09-26 ENCOUNTER — Encounter (HOSPITAL_COMMUNITY)
Admission: RE | Admit: 2022-09-26 | Discharge: 2022-09-26 | Disposition: A | Discharge status: Home / Self Care | Payer: No Typology Code available for payment source | Source: Ambulatory Visit | Attending: Internal Medicine | Admitting: Internal Medicine

## 2022-09-26 DIAGNOSIS — C73 Malignant neoplasm of thyroid gland: Secondary | ICD-10-CM | POA: Insufficient documentation

## 2022-12-31 ENCOUNTER — Encounter: Payer: Self-pay | Admitting: Internal Medicine

## 2022-12-31 ENCOUNTER — Ambulatory Visit (INDEPENDENT_AMBULATORY_CARE_PROVIDER_SITE_OTHER): Payer: No Typology Code available for payment source | Admitting: Internal Medicine

## 2022-12-31 VITALS — BP 126/82 | HR 90 | Ht 61.0 in | Wt 154.0 lb

## 2022-12-31 DIAGNOSIS — E119 Type 2 diabetes mellitus without complications: Secondary | ICD-10-CM | POA: Diagnosis not present

## 2022-12-31 DIAGNOSIS — E890 Postprocedural hypothyroidism: Secondary | ICD-10-CM

## 2022-12-31 DIAGNOSIS — C73 Malignant neoplasm of thyroid gland: Secondary | ICD-10-CM | POA: Diagnosis not present

## 2022-12-31 LAB — POCT GLYCOSYLATED HEMOGLOBIN (HGB A1C): Hemoglobin A1C: 6.6 % — AB (ref 4.0–5.6)

## 2022-12-31 LAB — BASIC METABOLIC PANEL
BUN: 10 mg/dL (ref 6–23)
CO2: 30 mEq/L (ref 19–32)
Calcium: 9.7 mg/dL (ref 8.4–10.5)
Chloride: 103 mEq/L (ref 96–112)
Creatinine, Ser: 0.79 mg/dL (ref 0.40–1.20)
GFR: 84.51 mL/min (ref >60.00)
Glucose, Bld: 89 mg/dL (ref 70–99)
Potassium: 3.9 mEq/L (ref 3.5–5.1)
Sodium: 140 mEq/L (ref 135–145)

## 2022-12-31 LAB — TSH: TSH: 0.07 u[IU]/mL — ABNORMAL LOW (ref 0.35–5.50)

## 2022-12-31 LAB — POCT GLUCOSE (DEVICE FOR HOME USE): POC Glucose: 112 mg/dl — AB (ref 70–99)
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
## 2023-01-01 MED ORDER — LEVOTHYROXINE SODIUM 112 MCG PO TABS: 112.0000 ug | ORAL_TABLET | Freq: Every day | ORAL | 3 refills | Status: DC

## 2023-02-01 ENCOUNTER — Ambulatory Visit: Payer: No Typology Code available for payment source | Admitting: Allergy

## 2023-02-22 ENCOUNTER — Encounter: Payer: Self-pay | Admitting: Allergy

## 2023-02-22 ENCOUNTER — Ambulatory Visit (INDEPENDENT_AMBULATORY_CARE_PROVIDER_SITE_OTHER): Payer: No Typology Code available for payment source | Admitting: Allergy

## 2023-02-22 ENCOUNTER — Other Ambulatory Visit: Payer: Self-pay

## 2023-02-22 VITALS — BP 112/80 | HR 98 | Temp 98.6°F | Resp 17 | Wt 154.1 lb

## 2023-02-22 DIAGNOSIS — J4540 Moderate persistent asthma, uncomplicated: Secondary | ICD-10-CM | POA: Diagnosis not present

## 2023-02-22 DIAGNOSIS — J3089 Other allergic rhinitis: Secondary | ICD-10-CM | POA: Diagnosis not present

## 2023-02-22 DIAGNOSIS — T781XXD Other adverse food reactions, not elsewhere classified, subsequent encounter: Secondary | ICD-10-CM

## 2023-02-22 DIAGNOSIS — T7800XD Anaphylactic reaction due to unspecified food, subsequent encounter: Secondary | ICD-10-CM

## 2023-02-22 NOTE — Progress Notes (Signed)
Follow-up Note  RE: Maureen Smith MRN: [id number] DOB: [birth year] Date of Office Visit: 02/22/2023   History of present illness: Maureen Smith is a 57 y.o. female presenting today for follow-up of asthma, allergic rhinitis, food allergy with oral allergy syndrome.  She was last seen in the office on 07/25/2022 by myself.  Discussed the use of AI scribe software for clinical note transcription with the patient, who gave verbal consent to proceed.  [facility] changed her medication regimen recently with changes in her nasal regimen due to nosebleeds (seen on exam as she states she was not having frank nosebleeds) caused by Flonase. She reports that the new nasal sprays, one of which is an antihistamine as it has a nasty taste, have significantly improved her symptoms.  She is not sure of the names of these sprays.  The patient also started on montelukast, an antileukotriene, which she was advised could take two to four weeks to show effects.  She does feel like it has helped with her asthma since she started.   In the past month, the patient experienced a severe asthma episode, characterized by coughing, shortness of breath, and chest pressure, which led to an ER visit. She was treated with steroids and had to use her rescue inhaler frequently. The patient also considered using her breathing machine, but the vials had expired. Since starting montelukast, the patient reports not needing her rescue inhaler and her breathing has improved.  The patient also has a history of food allergies, and noted recently after eating a dish with onions it caused itching in her throat and ears. She has an EpiPen but has not had to use it recently. She continues to avoid foods that have caused reactions in the past.     Review of systems: 10pt ROS negative unless noted above in HPI Past medical/social/surgical/family history have been reviewed and are unchanged unless specifically indicated below.  No  changes  Medication List: Current Outpatient Medications  Medication Sig Dispense Refill   albuterol (VENTOLIN HFA) 108 (90 Base) MCG/ACT inhaler Inhale 2 puffs into the lungs every 6 (six) hours as needed for wheezing or shortness of breath. 18 g 1   amLODipine (NORVASC) 5 MG tablet TAKE ONE TABLET BY MOUTH TWICE A DAY FOR BLOOD PRESSURE.     atorvastatin (LIPITOR) 40 MG tablet Take 1 tablet (40 mg total) by mouth daily. 90 tablet 3   betamethasone dipropionate (DIPROLENE) 0.05 % ointment 1 APPLICATION APPLY ON THE SKIN TWICE A DAY APPLY TO HANDS WITH FLARES. TAPER USE AS ABLE     Blood Glucose Monitoring Suppl (ONETOUCH VERIO) w/Device KIT 1 Device by Does not apply route daily in the afternoon. 1 kit 0   conjugated estrogens (PREMARIN) vaginal cream Place 1 Applicatorful vaginally every Monday, Wednesday, and Friday.      dicyclomine (BENTYL) 10 MG capsule Take 10 mg by mouth as needed for spasms.     empagliflozin (JARDIANCE) 10 MG TABS tablet Take 1 tablet (10 mg total) by mouth daily before breakfast. 90 tablet 1   EPINEPHrine (EPIPEN 2-PAK) 0.3 mg/0.3 mL IJ SOAJ injection Inject 0.3 mg into the muscle as needed for anaphylaxis. 2 each 1   EPINEPHrine (EPIPEN 2-PAK) 0.3 mg/0.3 mL IJ SOAJ injection Inject 0.3 mg into the muscle as needed for anaphylaxis. 0.3 mL 1   eplerenone (INSPRA) 50 MG tablet Take 1 tablet (50 mg total) by mouth daily. 90 tablet 3   fexofenadine (ALLEGRA) 180 MG tablet  Take 1 tablet (180 mg total) by mouth 2 (two) times daily as needed for allergies or rhinitis (Take two on allergy shot days). 60 tablet 5   fluticasone-salmeterol (WIXELA INHUB) 250-50 MCG/ACT AEPB Inhale 1 puff into the lungs in the morning and at bedtime. 60 each 5   glucose blood (ONETOUCH VERIO) test strip 1 each by Other route daily in the afternoon. Use as instructed 100 each 3   ketoconazole (NIZORAL) 2 % shampoo Apply 1 application topically as directed. 120 mL 0   levothyroxine (SYNTHROID) 112  MCG tablet Take 1 tablet (112 mcg total) by mouth daily. 90 tablet 3   loratadine (CLARITIN) 10 MG tablet Take 1 tablet (10 mg total) by mouth daily. 30 tablet 5   montelukast (SINGULAIR) 10 MG tablet Take 10 mg by mouth at bedtime.     olopatadine (PATANOL) 0.1 % ophthalmic solution Place 1 drop into both eyes 2 (two) times daily. 5 mL 5   OneTouch Delica Lancets 30G MISC Use to check blood sugars 2 times daily 100 each 3   potassium chloride SA (KLOR-CON) 20 MEQ tablet Take 1 tablet (20 mEq total) by mouth daily. 90 tablet 1   tacrolimus (PROTOPIC) 0.1 % ointment 1 APPLICATION APPLY ON THE SKIN TWICE A DAY APPLY TO AREAS ON FACE/EYELIDS 100 g 1   triamcinolone cream (KENALOG) 0.1 % Apply 1 Application topically 2 (two) times daily as needed (skin irritation). 30 g 1   Vitamin D, Ergocalciferol, (DRISDOL) 50000 UNITS CAPS capsule Take 50,000 Units by mouth every Wednesday.   0   WIXELA INHUB 500-50 MCG/ACT AEPB Inhale 1 puff into the lungs in the morning and at bedtime. 60 each 5   bisacodyl (DULCOLAX) 10 MG suppository Place 10 mg rectally as needed for moderate constipation. (Patient not taking: Reported on 02/22/2023)     docusate sodium (COLACE) 100 MG capsule Take 100 mg by mouth daily as needed for mild constipation. (Patient not taking: Reported on 02/22/2023)     fluocinolone (VANOS) 0.01 % cream Apply topically as needed. (Patient not taking: Reported on 02/22/2023)     fluticasone (FLONASE) 50 MCG/ACT nasal spray Place 1 spray into both nostrils daily as needed for allergies. (Patient not taking: Reported on 02/22/2023) 16 g 5   Magnesium Oxide 420 MG TABS TAKE ONE TABLET BY MOUTH TWICE A DAY FOR MUSCLE SPASMS FOR MUSCLE CRAMPS (Patient not taking: Reported on 02/22/2023)     meloxicam (MOBIC) 15 MG tablet Take 15 mg by mouth as needed. (Patient not taking: Reported on 02/22/2023)     No current facility-administered medications for this visit.     Known medication allergies: Allergies   Allergen Reactions   Amoxicillin-Pot Clavulanate Nausea And Vomiting and Other (See Comments)    Did it involve swelling of the face/tongue/throat, SOB, or low BP? No Did it involve sudden or severe rash/hives, skin peeling, or any reaction on the inside of your mouth or nose? No Did you need to seek medical attention at a hospital or doctor's office? Less than 10 years When did it last happen?       If all above answers are "NO", may proceed with cephalosporin use.  Did it involve swelling of the face/tongue/throat, SOB, or low BP? No Did it involve sudden or severe rash/hives, skin peeling, or any reaction on the inside of your mouth or nose? No Did you need to seek medical attention at a hospital or doctor's office? Less than 10 years When  did it last happen?       If all above answers are "NO", may proceed with cephalosporin use.    Spironolactone     Other reaction(s): Engorgement of breasts   Gold Sodium Thiosulfate Other (See Comments)    Positive patch test   Morphine Nausea And Vomiting     Physical examination: Blood pressure 112/80, pulse 98, temperature 98.6 F (37 C), temperature source Temporal, resp. rate 17, weight 154 lb 1.6 oz (69.9 kg), SpO2 97%.  General: Alert, interactive, in no acute distress. HEENT: PERRLA, TMs pearly gray, turbinates non-edematous without discharge, post-pharynx non erythematous. Neck: Supple without lymphadenopathy. Lungs: Clear to auscultation without wheezing, rhonchi or rales. {no increased work of breathing. CV: Normal S1, S2 without murmurs. Abdomen: Nondistended, nontender. Skin: Warm and dry, without lesions or rashes. Extremities:  No clubbing, cyanosis or edema. Neuro:   Grossly intact.  Diagnositics/Labs:  Spirometry: FEV1: 1.56L 76%, FVC: 1.87L 73%, ratio consistent with nonobstructive pattern for age  Assessment and plan:   Asthma   - lung function testing today looks good!  - continue Advair (Wixela) HFA  500/50 mcg  1  puff twice a day. Rinse mouth out afterwards  - continue Montelukast (Singulair) 10mg  daily. This is an antileukotriene that can help with both allergy and asthma symptom control.    - have access to albuterol inhaler 2 puffs or albuterol nebulizer every 4-6 hours as needed for cough/wheeze/shortness of breath/chest tightness.  May use 15-20 minutes prior to activity.   Monitor frequency of use.    Asthma control goals:  Full participation in all desired activities (may need albuterol before activity) Albuterol use two time or less a week on average (not counting use with activity) Cough interfering with sleep two time or less a month Oral steroids no more than once a year No hospitalizations  Allergic rhinitis -  take Allegra or Claritin daily as needed for runny nose/itching.   You still may need antihistamine while taking Singulair as Singulair is not an antihistamine.   - continue avoidance measures for grasses, weeds, trees, molds, dust mite, cat  - continue nasal saline spray/rinse as needed.  - continue your current nasal spray regimen (let me know which ones you are currently using)  Food allergy  - continue your current food avoidance   - have access to self-injectable epinephrine Epipen 0.3mg  at all times  - follow emergency action plan in case of allergic reaction  Oral allergy syndrome - avoid fruits that cause itching in your mouth - The oral allergy syndrome (OAS) or pollen-food allergy syndrome (PFAS) is a relatively common form of food allergy, particularly in adults. It typically occurs in people who have pollen allergies when the immune system "sees" proteins on the food that look like proteins on the pollen. This results in the allergy antibody (IgE) binding to the food instead of the pollen. Patients typically report itching and/or mild swelling of the mouth and throat immediately following ingestion of certain uncooked fruits (including nuts) or raw vegetables. Only a  very small number of affected individuals experience systemic allergic reactions, such as anaphylaxis which occurs with true food allergies.    Follow-up in 6 months or sooner if needed  I appreciate the opportunity to take part in Maureen Smith's care. Please do not hesitate to contact me with questions.  Sincerely,   Margo Aye, MD Allergy/Immunology [facility]

## 2023-02-22 NOTE — Patient Instructions (Addendum)
Asthma   - lung function testing today looks good!  - continue Advair (Wixela) HFA  500/50 mcg 1  puff twice a day. Rinse mouth out afterwards  - continue Montelukast (Singulair) 10mg  daily. This is an antileukotriene that can help with both allergy and asthma symptom control.    - have access to albuterol inhaler 2 puffs or albuterol nebulizer every 4-6 hours as needed for cough/wheeze/shortness of breath/chest tightness.  May use 15-20 minutes prior to activity.   Monitor frequency of use.    Asthma control goals:  Full participation in all desired activities (may need albuterol before activity) Albuterol use two time or less a week on average (not counting use with activity) Cough interfering with sleep two time or less a month Oral steroids no more than once a year No hospitalizations  Allergic rhinitis -  take Allegra or Claritin daily as needed for runny nose/itching.   You still may need antihistamine while taking Singulair as Singulair is not an antihistamine.   - continue avoidance measures for grasses, weeds, trees, molds, dust mite, cat  - continue nasal saline spray/rinse as needed.  - continue your current nasal spray regimen (let me know which ones you are currently using)  Food allergy  - continue your current food avoidance   - have access to self-injectable epinephrine Epipen 0.3mg  at all times  - follow emergency action plan in case of allergic reaction  Oral allergy syndrome - avoid fruits that cause itching in your mouth - The oral allergy syndrome (OAS) or pollen-food allergy syndrome (PFAS) is a relatively common form of food allergy, particularly in adults. It typically occurs in people who have pollen allergies when the immune system "sees" proteins on the food that look like proteins on the pollen. This results in the allergy antibody (IgE) binding to the food instead of the pollen. Patients typically report itching and/or mild swelling of the mouth and throat  immediately following ingestion of certain uncooked fruits (including nuts) or raw vegetables. Only a very small number of affected individuals experience systemic allergic reactions, such as anaphylaxis which occurs with true food allergies.    Follow-up in 6 months or sooner if needed

## 2023-03-22 ENCOUNTER — Telehealth: Payer: Self-pay | Admitting: Allergy

## 2023-03-22 NOTE — Telephone Encounter (Signed)
Patient's authorization, [id number] expires 04-20-2023.   Faxed renewal of authorization request to [facility], [phone] and emailed it to [email or web address].   Left message for patient about authorization.

## 2023-05-31 NOTE — Telephone Encounter (Signed)
 Called to discuss auth being closed with patient due to her not opening the mychart message I sent to her. Patient is aware and cancelled her appt for April.

## 2023-06-19 ENCOUNTER — Encounter: Payer: Self-pay | Admitting: Internal Medicine

## 2023-06-19 ENCOUNTER — Ambulatory Visit (INDEPENDENT_AMBULATORY_CARE_PROVIDER_SITE_OTHER): Payer: No Typology Code available for payment source | Admitting: Internal Medicine

## 2023-06-19 VITALS — BP 132/80 | HR 74 | Wt 155.0 lb

## 2023-06-19 DIAGNOSIS — Z7984 Long term (current) use of oral hypoglycemic drugs: Secondary | ICD-10-CM | POA: Diagnosis not present

## 2023-06-19 DIAGNOSIS — Z85850 Personal history of malignant neoplasm of thyroid: Secondary | ICD-10-CM | POA: Insufficient documentation

## 2023-06-19 DIAGNOSIS — E890 Postprocedural hypothyroidism: Secondary | ICD-10-CM | POA: Diagnosis not present

## 2023-06-19 DIAGNOSIS — E119 Type 2 diabetes mellitus without complications: Secondary | ICD-10-CM

## 2023-06-19 DIAGNOSIS — E269 Hyperaldosteronism, unspecified: Secondary | ICD-10-CM | POA: Insufficient documentation

## 2023-06-19 LAB — POCT GLUCOSE (DEVICE FOR HOME USE): POC Glucose: 182 mg/dL — AB (ref 70–99)

## 2023-06-19 LAB — POCT GLYCOSYLATED HEMOGLOBIN (HGB A1C): Hemoglobin A1C: 6.6 % — AB (ref 4.0–5.6)
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
## 2023-06-20 ENCOUNTER — Ambulatory Visit
Admission: RE | Admit: 2023-06-20 | Discharge: 2023-06-20 | Disposition: A | Discharge status: Home / Self Care | Payer: No Typology Code available for payment source | Source: Ambulatory Visit | Attending: Internal Medicine | Admitting: Internal Medicine

## 2023-06-20 ENCOUNTER — Other Ambulatory Visit: Payer: Self-pay | Admitting: Interventional Radiology

## 2023-06-20 ENCOUNTER — Encounter: Payer: Self-pay | Admitting: Internal Medicine

## 2023-06-20 DIAGNOSIS — E890 Postprocedural hypothyroidism: Secondary | ICD-10-CM

## 2023-06-20 DIAGNOSIS — Z85850 Personal history of malignant neoplasm of thyroid: Secondary | ICD-10-CM

## 2023-06-21 ENCOUNTER — Other Ambulatory Visit: Payer: No Typology Code available for payment source

## 2023-06-22 LAB — TSH: TSH: 0.12 m[IU]/L — ABNORMAL LOW

## 2023-06-22 LAB — T4, FREE: Free T4: 1.4 ng/dL (ref 0.8–1.8)

## 2023-06-24 ENCOUNTER — Other Ambulatory Visit (HOSPITAL_COMMUNITY): Payer: Self-pay | Admitting: Pain Medicine

## 2023-06-24 ENCOUNTER — Encounter: Payer: Self-pay | Admitting: Internal Medicine

## 2023-06-24 ENCOUNTER — Ambulatory Visit: Payer: No Typology Code available for payment source | Admitting: Internal Medicine

## 2023-06-24 DIAGNOSIS — R209 Unspecified disturbances of skin sensation: Secondary | ICD-10-CM

## 2023-06-25 ENCOUNTER — Encounter: Payer: Self-pay | Admitting: Internal Medicine

## 2023-06-25 ENCOUNTER — Ambulatory Visit
Admission: RE | Admit: 2023-06-25 | Discharge: 2023-06-25 | Disposition: A | Discharge status: Home / Self Care | Payer: No Typology Code available for payment source | Source: Ambulatory Visit | Attending: Interventional Radiology | Admitting: Interventional Radiology

## 2023-06-25 ENCOUNTER — Telehealth: Payer: Self-pay | Admitting: Internal Medicine

## 2023-06-25 DIAGNOSIS — Z85850 Personal history of malignant neoplasm of thyroid: Secondary | ICD-10-CM

## 2023-06-25 MED ORDER — LEVOTHYROXINE SODIUM 112 MCG PO TABS: 112.0000 ug | ORAL_TABLET | ORAL | Status: AC

## 2023-06-25 NOTE — Telephone Encounter (Signed)
 Spoke to the patient on 2//2025 at 10:30 AM   Discussed TSH level, which is appropriate for history of thyroid  cancer but the patient is symptomatic.   We have opted to decrease levothyroxine  as below   Levothyroxine  112 mcg, half a tablet on Sundays and 1 tablet rest of the week

## 2023-06-26 ENCOUNTER — Ambulatory Visit (HOSPITAL_COMMUNITY)
Admission: RE | Admit: 2023-06-26 | Discharge: 2023-06-26 | Disposition: A | Discharge status: Home / Self Care | Payer: No Typology Code available for payment source | Source: Ambulatory Visit | Attending: Pain Medicine | Admitting: Pain Medicine

## 2023-06-26 ENCOUNTER — Encounter (HOSPITAL_COMMUNITY): Payer: Self-pay | Admitting: Pain Medicine

## 2023-06-26 DIAGNOSIS — R209 Unspecified disturbances of skin sensation: Secondary | ICD-10-CM

## 2023-06-26 LAB — VAS US ABI WITH/WO TBI
Left ABI: 1.02
Right ABI: 1.03

## 2023-06-26 NOTE — Progress Notes (Signed)
 ABI's have been completed. Preliminary results can be found in CV Proc through chart review.   06/26/23 3:11 PM Birda Buffy RVT

## 2023-06-27 ENCOUNTER — Encounter (HOSPITAL_BASED_OUTPATIENT_CLINIC_OR_DEPARTMENT_OTHER): Payer: Self-pay

## 2023-06-27 ENCOUNTER — Encounter: Payer: Self-pay | Admitting: Internal Medicine

## 2023-07-02 ENCOUNTER — Ambulatory Visit: Payer: No Typology Code available for payment source | Admitting: Internal Medicine

## 2023-07-31 NOTE — Telephone Encounter (Signed)
 While the left toe is abnormal the ABI of the left leg was overall normal. This is a reassuring result and indicates no significant peripheral arterial disease. No need for intervention at this time. The best thing to help keep optimal blood flow in the legs is to walk regularly and avoid tobacco products.   Alver Sorrow, NP

## 2023-08-20 ENCOUNTER — Encounter: Payer: Self-pay | Admitting: Internal Medicine

## 2023-08-21 ENCOUNTER — Other Ambulatory Visit: Payer: Self-pay

## 2023-08-23 ENCOUNTER — Ambulatory Visit: Payer: No Typology Code available for payment source | Admitting: Allergy

## 2023-09-01 ENCOUNTER — Encounter: Payer: Self-pay | Admitting: Internal Medicine

## 2023-09-02 ENCOUNTER — Other Ambulatory Visit: Payer: Self-pay

## 2023-09-02 MED ORDER — EMPAGLIFLOZIN 10 MG PO TABS: 10.0000 mg | ORAL_TABLET | Freq: Every day | ORAL | 1 refills | Status: DC

## 2023-09-11 ENCOUNTER — Telehealth: Payer: Self-pay

## 2023-09-11 NOTE — Telephone Encounter (Signed)
 Langley Pippin office notified

## 2023-09-11 NOTE — Telephone Encounter (Signed)
 Langley Pippin NP from [facility] would like to start patient a GLP and would like to know if this is okay or if patient needs to discuss with you to prescribe.   CB [phone]

## 2023-10-29 ENCOUNTER — Encounter: Payer: Self-pay | Admitting: Internal Medicine

## 2023-12-18 ENCOUNTER — Ambulatory Visit: Payer: No Typology Code available for payment source | Admitting: Internal Medicine

## 2024-03-03 ENCOUNTER — Telehealth: Payer: Self-pay | Admitting: Orthopedic Surgery

## 2024-03-03 NOTE — Telephone Encounter (Signed)
 Pt called stating that they are going out of town and going to be doing a lot of walking and her hip is really bothering her and is wondering if there is any OTC medicine that they can take. Pt call back number is [phone]. Pt is leaving to go out of town on Thursday

## 2024-03-03 NOTE — Telephone Encounter (Signed)
**  PLEASE DISREGARD THIS MESSAGE** This message was taken in the wrong chart----message moved to correct patients chart.

## 2024-03-06 ENCOUNTER — Ambulatory Visit: Payer: Medicare (Managed Care) | Admitting: Internal Medicine

## 2024-03-06 ENCOUNTER — Other Ambulatory Visit: Payer: Medicare (Managed Care)

## 2024-03-06 ENCOUNTER — Encounter: Payer: Self-pay | Admitting: Internal Medicine

## 2024-03-06 VITALS — BP 124/80 | HR 97 | Ht 61.0 in | Wt 158.0 lb

## 2024-03-06 DIAGNOSIS — Z7984 Long term (current) use of oral hypoglycemic drugs: Secondary | ICD-10-CM | POA: Diagnosis not present

## 2024-03-06 DIAGNOSIS — E890 Postprocedural hypothyroidism: Secondary | ICD-10-CM

## 2024-03-06 DIAGNOSIS — E1129 Type 2 diabetes mellitus with other diabetic kidney complication: Secondary | ICD-10-CM | POA: Diagnosis not present

## 2024-03-06 DIAGNOSIS — Z85850 Personal history of malignant neoplasm of thyroid: Secondary | ICD-10-CM | POA: Diagnosis not present

## 2024-03-06 DIAGNOSIS — E269 Hyperaldosteronism, unspecified: Secondary | ICD-10-CM | POA: Diagnosis not present

## 2024-03-06 DIAGNOSIS — R809 Proteinuria, unspecified: Secondary | ICD-10-CM

## 2024-03-06 DIAGNOSIS — E119 Type 2 diabetes mellitus without complications: Secondary | ICD-10-CM

## 2024-03-06 LAB — POCT GLUCOSE (DEVICE FOR HOME USE): Glucose Fasting, POC: 136 mg/dL — AB (ref 70–99)

## 2024-03-06 LAB — POCT GLYCOSYLATED HEMOGLOBIN (HGB A1C): Hemoglobin A1C: 7.2 % — AB (ref 4.0–5.6)

## 2024-03-06 MED ORDER — RYBELSUS 7 MG PO TABS: 7.0000 mg | ORAL_TABLET | Freq: Every day | ORAL | 3 refills | Status: AC

## 2024-03-06 MED ORDER — RYBELSUS 3 MG PO TABS: 3.0000 mg | ORAL_TABLET | Freq: Every day | ORAL | 0 refills | Status: AC
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
## 2024-03-06 NOTE — Patient Instructions (Signed)
 Start Rybelsus 3 mg, 1 tablet half an hour before breakfast for 30 days, then increase to 7 mg daily Continue Jardiance  10 mg daily

## 2024-03-07 LAB — MICROALBUMIN / CREATININE URINE RATIO
Creatinine, Urine: 59 mg/dL (ref 20–275)
Microalb Creat Ratio: 59 mg/g{creat} — ABNORMAL HIGH (ref <30)
Microalb, Ur: 3.5 mg/dL

## 2024-03-09 ENCOUNTER — Ambulatory Visit: Payer: Self-pay | Admitting: Internal Medicine

## 2024-03-09 DIAGNOSIS — E1129 Type 2 diabetes mellitus with other diabetic kidney complication: Secondary | ICD-10-CM | POA: Insufficient documentation

## 2024-03-09 MED ORDER — EMPAGLIFLOZIN 25 MG PO TABS: 25.0000 mg | ORAL_TABLET | Freq: Every day | ORAL | 3 refills | Status: AC

## 2024-03-16 ENCOUNTER — Other Ambulatory Visit

## 2024-03-23 ENCOUNTER — Encounter: Payer: Self-pay | Admitting: Radiology

## 2024-03-27 LAB — THYROGLOBULIN ANTIBODY: Thyroglobulin Ab: <1 [IU]/mL (ref <=1)

## 2024-03-27 LAB — ALDOSTERONE + RENIN ACTIVITY W/ RATIO
ALDO / PRA Ratio: 20.4 ratio (ref 0.9–28.9)
Aldosterone: 22 ng/dL
Renin Activity: 1.08 ng/mL/h (ref 0.25–5.82)

## 2024-03-27 LAB — TSH: TSH: 1.49 m[IU]/L (ref 0.40–4.50)

## 2024-03-27 LAB — THYROGLOBULIN LEVEL: Thyroglobulin: 0.2 ng/mL — ABNORMAL LOW

## 2024-03-27 LAB — POTASSIUM: Potassium: 4.1 mmol/L (ref 3.5–5.3)

## 2024-04-03 ENCOUNTER — Ambulatory Visit
Admission: RE | Admit: 2024-04-03 | Discharge: 2024-04-03 | Disposition: A | Discharge status: Home / Self Care | Source: Ambulatory Visit | Attending: Internal Medicine | Admitting: Internal Medicine

## 2024-04-03 ENCOUNTER — Encounter: Payer: Self-pay | Admitting: Internal Medicine

## 2024-04-03 DIAGNOSIS — Z85850 Personal history of malignant neoplasm of thyroid: Secondary | ICD-10-CM

## 2024-09-04 ENCOUNTER — Ambulatory Visit: Admitting: Internal Medicine
